# Patient Record
Sex: Male | Born: 1945 | Race: White | Hispanic: No | Marital: Single | Smoking: Never smoker
Health system: Southern US, Community
[De-identification: ages and names within clinical notes are randomized; demographics above are authoritative.]

## PROBLEM LIST (undated history)

## (undated) DIAGNOSIS — I4892 Unspecified atrial flutter: Secondary | ICD-10-CM

## (undated) DIAGNOSIS — I214 Non-ST elevation (NSTEMI) myocardial infarction: Secondary | ICD-10-CM

## (undated) DIAGNOSIS — N182 Chronic kidney disease, stage 2 (mild): Secondary | ICD-10-CM

## (undated) DIAGNOSIS — I959 Hypotension, unspecified: Secondary | ICD-10-CM

## (undated) DIAGNOSIS — R339 Retention of urine, unspecified: Secondary | ICD-10-CM

## (undated) DIAGNOSIS — E785 Hyperlipidemia, unspecified: Secondary | ICD-10-CM

## (undated) HISTORY — DX: Hyperlipidemia, unspecified: E78.5

## (undated) HISTORY — PX: VARICOSE VEIN SURGERY: SHX832

## (undated) HISTORY — PX: KNEE ARTHROSCOPY W/ MENISCAL REPAIR: SHX1877

## (undated) HISTORY — PX: BASAL CELL CARCINOMA EXCISION: SHX1214

---

## 2010-02-08 ENCOUNTER — Encounter
Admission: RE | Admit: 2010-02-08 | Discharge: 2010-02-08 | Payer: Self-pay | Source: Home / Self Care | Attending: Internal Medicine | Admitting: Internal Medicine

## 2010-08-15 ENCOUNTER — Encounter (INDEPENDENT_AMBULATORY_CARE_PROVIDER_SITE_OTHER): Payer: Self-pay | Admitting: Surgery

## 2010-08-17 ENCOUNTER — Encounter (INDEPENDENT_AMBULATORY_CARE_PROVIDER_SITE_OTHER): Payer: Self-pay | Admitting: Surgery

## 2011-04-20 ENCOUNTER — Other Ambulatory Visit: Payer: Self-pay | Admitting: Oncology

## 2015-01-28 ENCOUNTER — Other Ambulatory Visit: Payer: Self-pay | Admitting: Gastroenterology

## 2015-03-18 ENCOUNTER — Encounter (HOSPITAL_COMMUNITY): Payer: Self-pay | Admitting: *Deleted

## 2015-03-29 ENCOUNTER — Ambulatory Visit (HOSPITAL_COMMUNITY): Payer: Medicare Other | Admitting: Anesthesiology

## 2015-03-29 ENCOUNTER — Encounter (HOSPITAL_COMMUNITY)
Admission: RE | Disposition: A | Discharge status: Home / Self Care | Payer: Self-pay | Source: Ambulatory Visit | Attending: Gastroenterology

## 2015-03-29 ENCOUNTER — Ambulatory Visit (HOSPITAL_COMMUNITY)
Admission: RE | Admit: 2015-03-29 | Discharge: 2015-03-29 | Disposition: A | Discharge status: Home / Self Care | Payer: Medicare Other | Source: Ambulatory Visit | Attending: Gastroenterology | Admitting: Gastroenterology

## 2015-03-29 ENCOUNTER — Encounter (HOSPITAL_COMMUNITY): Payer: Self-pay

## 2015-03-29 DIAGNOSIS — Z85828 Personal history of other malignant neoplasm of skin: Secondary | ICD-10-CM | POA: Insufficient documentation

## 2015-03-29 DIAGNOSIS — Z1211 Encounter for screening for malignant neoplasm of colon: Secondary | ICD-10-CM | POA: Insufficient documentation

## 2015-03-29 DIAGNOSIS — E7800 Pure hypercholesterolemia, unspecified: Secondary | ICD-10-CM | POA: Insufficient documentation

## 2015-03-29 DIAGNOSIS — Z86010 Personal history of colonic polyps: Secondary | ICD-10-CM | POA: Insufficient documentation

## 2015-03-29 HISTORY — PX: COLONOSCOPY WITH PROPOFOL: SHX5780

## 2015-03-29 SURGERY — COLONOSCOPY WITH PROPOFOL
Anesthesia: Monitor Anesthesia Care

## 2015-03-29 MED ORDER — LIDOCAINE HCL (CARDIAC) 20 MG/ML IV SOLN
INTRAVENOUS | Status: AC
Start: 2015-03-29 — End: 2015-03-29
  Filled 2015-03-29: qty 5

## 2015-03-29 MED ORDER — PROPOFOL 10 MG/ML IV BOLUS
INTRAVENOUS | Status: DC | PRN
  Administered 2015-03-29: 20 mg via INTRAVENOUS
  Administered 2015-03-29: 10 mg via INTRAVENOUS
  Administered 2015-03-29: 30 mg via INTRAVENOUS
  Administered 2015-03-29: 20 mg via INTRAVENOUS

## 2015-03-29 MED ORDER — SODIUM CHLORIDE 0.9 % IV SOLN: INTRAVENOUS | Status: DC

## 2015-03-29 MED ORDER — PROPOFOL 10 MG/ML IV BOLUS
INTRAVENOUS | Status: AC
  Filled 2015-03-29: qty 60

## 2015-03-29 MED ORDER — LACTATED RINGERS IV SOLN
INTRAVENOUS | Status: DC
  Administered 2015-03-29: 1000 mL via INTRAVENOUS

## 2015-03-29 MED ORDER — PROPOFOL 500 MG/50ML IV EMUL
INTRAVENOUS | Status: DC | PRN
  Administered 2015-03-29: 100 ug/kg/min via INTRAVENOUS

## 2015-03-29 MED ORDER — LIDOCAINE HCL (CARDIAC) 20 MG/ML IV SOLN
INTRAVENOUS | Status: DC | PRN
  Administered 2015-03-29: 25 mg via INTRATRACHEAL

## 2015-03-29 SURGICAL SUPPLY — 21 items

## 2015-03-29 NOTE — Anesthesia Preprocedure Evaluation (Signed)
Anesthesia Evaluation  Patient identified by MRN, date of birth, ID band Patient awake    Reviewed: Allergy & Precautions, H&P , NPO status , Patient's Chart, lab work & pertinent test results  History of Anesthesia Complications Negative for: history of anesthetic complications  Airway Mallampati: II  TM Distance: >3 FB Neck ROM: full    Dental no notable dental hx.    Pulmonary neg pulmonary ROS,    Pulmonary exam normal breath sounds clear to auscultation       Cardiovascular negative cardio ROS Normal cardiovascular exam Rhythm:regular Rate:Normal     Neuro/Psych negative neurological ROS     GI/Hepatic negative GI ROS, Neg liver ROS,   Endo/Other  negative endocrine ROS  Renal/GU negative Renal ROS     Musculoskeletal   Abdominal   Peds  Hematology negative hematology ROS (+)   Anesthesia Other Findings   Reproductive/Obstetrics negative OB ROS                             Anesthesia Physical Anesthesia Plan  ASA: II  Anesthesia Plan: MAC   Post-op Pain Management:    Induction: Intravenous  Airway Management Planned: Simple Face Mask  Additional Equipment:   Intra-op Plan:   Post-operative Plan:   Informed Consent: I have reviewed the patients History and Physical, chart, labs and discussed the procedure including the risks, benefits and alternatives for the proposed anesthesia with the patient or authorized representative who has indicated his/her understanding and acceptance.   Dental Advisory Given  Plan Discussed with: Anesthesiologist, CRNA and Surgeon  Anesthesia Plan Comments:         Anesthesia Quick Evaluation  

## 2015-03-29 NOTE — H&P (Signed)
  Procedure: Surveillance colonoscopy. 12/07/2009 normal surveillance colonoscopy was. In 2005, two 2 mm adenomatous colon polyps were removed colonoscopically.  History: The patient is a 70 year old male born 18-Aug-1945. He is scheduled to undergo a surveillance colonoscopy today.  Medication allergies: None  Past medical history: Hypercholesterolemia. Left knee surgery. Varicose vein surgery. Basal cell skin cancer removal.  Exam: The patient is alert and lying comfortably on the endoscopy stretcher. Abdomen is soft and nontender to palpation. Lungs are clear to auscultation. Cardiac exam reveals a regular rhythm.  Plan: Proceed with surveillance colonoscopy

## 2015-03-29 NOTE — Anesthesia Postprocedure Evaluation (Signed)
Anesthesia Post Note  Patient: Richard Perkins  Procedure(s) Performed: Procedure(s) (LRB): COLONOSCOPY WITH PROPOFOL (N/A)  Patient location during evaluation: PACU Anesthesia Type: MAC Level of consciousness: awake and alert Pain management: pain level controlled Vital Signs Assessment: post-procedure vital signs reviewed and stable Respiratory status: spontaneous breathing, nonlabored ventilation, respiratory function stable and patient connected to nasal cannula oxygen Cardiovascular status: stable and blood pressure returned to baseline Anesthetic complications: no    Last Vitals:  Filed Vitals:   03/29/15 1020 03/29/15 1025  BP: 103/56 105/55  Pulse: 51 51  Temp:    Resp: 15 13    Last Pain: There were no vitals filed for this visit.               Reino KentJudd, Shubham Thackston J

## 2015-03-29 NOTE — Op Note (Signed)
[facility] Patient Name: Richard Perkins Procedure Date: 03/29/2015 MRN: [id number] Attending MD: Charolett Bumpers , MD Date of Birth: [birth year] CSN:  Age: 70 Admit Type: Outpatient Account #: [id number] Procedure:                Colonoscopy Indications:              High risk colon cancer surveillance: Personal                            history of adenoma less than 10 mm in size Providers:                Charolett Bumpers, MD, Anthony Sar, RN, Harrington Challenger, Technician, Clearnce Sorrel, Delphia Grates,                            CRNA Referring MD:              Medicines:                Propofol per Anesthesia Complications:            No immediate complications. Estimated Blood Loss:      Procedure:                Pre-Anesthesia Assessment:                           - Prior to the procedure, a History and Physical                            was performed, and patient medications and                            allergies were reviewed. The patient is competent.                            The risks and benefits of the procedure and the                            sedation options and risks were discussed with the                            patient. All questions were answered and informed                            consent was obtained. Patient identification and                            proposed procedure were verified. Mental Status                            Examination: normal. Prophylactic Antibiotics: The                            patient  does not require prophylactic antibiotics.                            Prior Anticoagulants: The patient has taken no                            previous anticoagulant or antiplatelet agents. ASA                            Grade Assessment: II - A patient with mild systemic                            disease. After reviewing the risks and benefits,                            the patient was deemed in  satisfactory condition to                            undergo the procedure. The anesthesia plan was to                            use monitored anesthesia care (MAC). Immediately                            prior to administration of medications, the patient                            was re-assessed for adequacy to receive sedatives.                            The heart rate, respiratory rate, oxygen                            saturations, blood pressure, adequacy of pulmonary                            ventilation, and response to care were monitored                            throughout the procedure. The physical status of                            the patient was re-assessed after the procedure.                           After obtaining informed consent, the colonoscope                            was passed under direct vision. Throughout the                            procedure, the patient's blood pressure, pulse, and  oxygen saturations were monitored continuously. The                            EC-[id number] ([id number]) scope was introduced through                            the anus and advanced to the the cecum, identified                            by appendiceal orifice and ileocecal valve. The                            colonoscopy was performed without difficulty. The                            patient tolerated the procedure well. The quality                            of the bowel preparation was adequate. The                            ileocecal valve, the appendiceal orifice and the                            rectum were photographed. Scope In: 9:38:20 AM Scope Out: 9:57:01 AM Scope Withdrawal Time: 0 hours 9 minutes 14 seconds  Total Procedure Duration: 0 hours 18 minutes 41 seconds  Findings:      The entire examined colon appeared normal. Impression:               - The entire examined colon is normal.                           - No specimens  collected. Moderate Sedation:      N/A- Per Anesthesia Care Recommendation:           - Patient has a contact number available for                            emergencies. The signs and symptoms of potential                            delayed complications were discussed with the                            patient. Return to normal activities tomorrow.                            Written discharge instructions were provided to the                            patient.                           - Patient has a contact number available for  emergencies. The signs and symptoms of potential                            delayed complications were discussed with the                            patient. Return to normal activities tomorrow.                            Written discharge instructions were provided to the                            patient.                           - Repeat colonoscopy in 5 years for surveillance. Procedure Code(s):        --- Professional ---                           W0981, Colorectal cancer screening; colonoscopy on                            individual at high risk Diagnosis Code(s):        --- Professional ---                           Z86.010, Personal history of colonic polyps CPT copyright 2016 American Medical Association. All rights reserved. The codes documented in this report are preliminary and upon coder review may  be revised to meet current compliance requirements. Danise Edge, [facility] Charolett Bumpers, MD 03/29/2015 10:06:06 AM This report has been signed electronically. Number of Addenda: 0

## 2015-03-29 NOTE — Discharge Instructions (Signed)

## 2015-03-29 NOTE — Transfer of Care (Signed)
Immediate Anesthesia Transfer of Care Note  Patient: Richard Perkins  Procedure(s) Performed: Procedure(s): COLONOSCOPY WITH PROPOFOL (N/A)  Patient Location: PACU and Endoscopy Unit  Anesthesia Type:MAC  Level of Consciousness: awake and patient cooperative  Airway & Oxygen Therapy: Patient Spontanous Breathing and Patient connected to face mask oxygen  Post-op Assessment: Report given to RN and Post -op Vital signs reviewed and stable  Post vital signs: Reviewed and stable  Last Vitals:  Filed Vitals:   03/29/15 0841  BP: 108/55  Pulse: 53  Temp: 36.4 C  Resp: 10    Complications: No apparent anesthesia complications

## 2015-03-31 ENCOUNTER — Encounter (HOSPITAL_COMMUNITY): Payer: Self-pay | Admitting: Gastroenterology

## 2018-01-18 ENCOUNTER — Inpatient Hospital Stay (HOSPITAL_COMMUNITY)
Admission: EM | Admit: 2018-01-18 | Discharge: 2018-01-22 | DRG: 281 | Disposition: A | Discharge status: Home / Self Care | Payer: Medicare Other | Attending: Internal Medicine | Admitting: Internal Medicine

## 2018-01-18 ENCOUNTER — Emergency Department (HOSPITAL_COMMUNITY): Payer: Medicare Other

## 2018-01-18 ENCOUNTER — Encounter (HOSPITAL_COMMUNITY): Payer: Self-pay | Admitting: Emergency Medicine

## 2018-01-18 DIAGNOSIS — I214 Non-ST elevation (NSTEMI) myocardial infarction: Secondary | ICD-10-CM | POA: Diagnosis not present

## 2018-01-18 DIAGNOSIS — N182 Chronic kidney disease, stage 2 (mild): Secondary | ICD-10-CM | POA: Diagnosis present

## 2018-01-18 DIAGNOSIS — I483 Typical atrial flutter: Secondary | ICD-10-CM | POA: Diagnosis not present

## 2018-01-18 DIAGNOSIS — Z8249 Family history of ischemic heart disease and other diseases of the circulatory system: Secondary | ICD-10-CM

## 2018-01-18 DIAGNOSIS — I959 Hypotension, unspecified: Secondary | ICD-10-CM | POA: Diagnosis present

## 2018-01-18 DIAGNOSIS — N183 Chronic kidney disease, stage 3 (moderate): Secondary | ICD-10-CM | POA: Diagnosis present

## 2018-01-18 DIAGNOSIS — R339 Retention of urine, unspecified: Secondary | ICD-10-CM | POA: Diagnosis present

## 2018-01-18 DIAGNOSIS — I4430 Unspecified atrioventricular block: Secondary | ICD-10-CM | POA: Diagnosis present

## 2018-01-18 DIAGNOSIS — R7989 Other specified abnormal findings of blood chemistry: Secondary | ICD-10-CM | POA: Diagnosis present

## 2018-01-18 DIAGNOSIS — I4892 Unspecified atrial flutter: Secondary | ICD-10-CM | POA: Diagnosis present

## 2018-01-18 DIAGNOSIS — R778 Other specified abnormalities of plasma proteins: Secondary | ICD-10-CM | POA: Diagnosis present

## 2018-01-18 DIAGNOSIS — Z85828 Personal history of other malignant neoplasm of skin: Secondary | ICD-10-CM

## 2018-01-18 DIAGNOSIS — Z79899 Other long term (current) drug therapy: Secondary | ICD-10-CM

## 2018-01-18 DIAGNOSIS — E785 Hyperlipidemia, unspecified: Secondary | ICD-10-CM | POA: Diagnosis present

## 2018-01-18 HISTORY — DX: Hypotension, unspecified: I95.9

## 2018-01-18 HISTORY — DX: Chronic kidney disease, stage 2 (mild): N18.2

## 2018-01-18 HISTORY — DX: Retention of urine, unspecified: R33.9

## 2018-01-18 HISTORY — DX: Non-ST elevation (NSTEMI) myocardial infarction: I21.4

## 2018-01-18 HISTORY — DX: Unspecified atrial flutter: I48.92

## 2018-01-18 LAB — COMPREHENSIVE METABOLIC PANEL
ALT: 22 U/L (ref 0–44)
AST: 29 U/L (ref 15–41)
Albumin: 3.9 g/dL (ref 3.5–5.0)
Alkaline Phosphatase: 36 U/L — ABNORMAL LOW (ref 38–126)
Anion gap: 6 (ref 5–15)
BUN: 18 mg/dL (ref 8–23)
CALCIUM: 9 mg/dL (ref 8.9–10.3)
CO2: 27 mmol/L (ref 22–32)
CREATININE: 1.26 mg/dL — AB (ref 0.61–1.24)
Chloride: 106 mmol/L (ref 98–111)
GFR, EST NON AFRICAN AMERICAN: 56 mL/min — AB (ref >60)
Glucose, Bld: 115 mg/dL — ABNORMAL HIGH (ref 70–99)
Potassium: 4.2 mmol/L (ref 3.5–5.1)
Sodium: 139 mmol/L (ref 135–145)
Total Bilirubin: 0.9 mg/dL (ref 0.3–1.2)
Total Protein: 6.5 g/dL (ref 6.5–8.1)

## 2018-01-18 LAB — CBC WITH DIFFERENTIAL/PLATELET
ABS IMMATURE GRANULOCYTES: 0.01 10*3/uL (ref 0.00–0.07)
BASOS PCT: 1 %
Basophils Absolute: 0.1 10*3/uL (ref 0.0–0.1)
Eosinophils Absolute: 0 10*3/uL (ref 0.0–0.5)
Eosinophils Relative: 1 %
HEMATOCRIT: 41.6 % (ref 39.0–52.0)
Hemoglobin: 13.1 g/dL (ref 13.0–17.0)
IMMATURE GRANULOCYTES: 0 %
Lymphocytes Relative: 18 %
Lymphs Abs: 0.9 10*3/uL (ref 0.7–4.0)
MCH: 32.2 pg (ref 26.0–34.0)
MCHC: 31.5 g/dL (ref 30.0–36.0)
MCV: 102.2 fL — AB (ref 80.0–100.0)
MONO ABS: 0.4 10*3/uL (ref 0.1–1.0)
MONOS PCT: 7 %
NEUTROS ABS: 3.8 10*3/uL (ref 1.7–7.7)
NEUTROS PCT: 73 %
PLATELETS: 243 10*3/uL (ref 150–400)
RBC: 4.07 MIL/uL — ABNORMAL LOW (ref 4.22–5.81)
RDW: 11.9 % (ref 11.5–15.5)
WBC: 5.2 10*3/uL (ref 4.0–10.5)
nRBC: 0 % (ref 0.0–0.2)

## 2018-01-18 LAB — HEPARIN LEVEL (UNFRACTIONATED): HEPARIN UNFRACTIONATED: 0.41 [IU]/mL (ref 0.30–0.70)

## 2018-01-18 LAB — I-STAT TROPONIN, ED
Troponin i, poc: 0.19 ng/mL (ref 0.00–0.08)
Troponin i, poc: 0.55 ng/mL (ref 0.00–0.08)

## 2018-01-18 LAB — TROPONIN I: Troponin I: 1.62 ng/mL (ref <0.03)

## 2018-01-18 MED ORDER — ATORVASTATIN CALCIUM 80 MG PO TABS
80.0000 mg | ORAL_TABLET | Freq: Every day | ORAL | Status: DC
  Administered 2018-01-20 – 2018-01-21 (×2): 80 mg via ORAL
  Filled 2018-01-18 (×2): qty 1

## 2018-01-18 MED ORDER — HEPARIN (PORCINE) 25000 UT/250ML-% IV SOLN
900.0000 [IU]/h | INTRAVENOUS | Status: DC
  Administered 2018-01-18 – 2018-01-19 (×2): 800 [IU]/h via INTRAVENOUS
  Administered 2018-01-20: 900 [IU]/h via INTRAVENOUS
  Filled 2018-01-18 (×3): qty 250

## 2018-01-18 MED ORDER — HEPARIN BOLUS VIA INFUSION
3900.0000 [IU] | Freq: Once | INTRAVENOUS | Status: AC
  Administered 2018-01-18: 3900 [IU] via INTRAVENOUS
  Filled 2018-01-18: qty 3900

## 2018-01-18 MED ORDER — TAMSULOSIN HCL 0.4 MG PO CAPS
0.4000 mg | ORAL_CAPSULE | Freq: Every day | ORAL | Status: DC
  Administered 2018-01-18 – 2018-01-21 (×4): 0.4 mg via ORAL
  Filled 2018-01-18 (×4): qty 1

## 2018-01-18 MED ORDER — ONDANSETRON HCL 4 MG/2ML IJ SOLN: 4.0000 mg | Freq: Four times a day (QID) | INTRAMUSCULAR | Status: DC | PRN

## 2018-01-18 MED ORDER — ACETAMINOPHEN 325 MG PO TABS
650.0000 mg | ORAL_TABLET | ORAL | Status: DC | PRN
  Administered 2018-01-21: 650 mg via ORAL
  Filled 2018-01-18: qty 2

## 2018-01-18 MED ORDER — ASPIRIN EC 81 MG PO TBEC
81.0000 mg | DELAYED_RELEASE_TABLET | Freq: Every day | ORAL | Status: DC
  Administered 2018-01-19 – 2018-01-22 (×3): 81 mg via ORAL
  Filled 2018-01-18 (×4): qty 1

## 2018-01-18 NOTE — ED Provider Notes (Signed)
[facility] Note   CSN: [id number] Arrival date & time: 01/18/18  1032     History   Chief Complaint Chief Complaint  Patient presents with  . Atrial Flutter    HPI Richard Perkins is a 73 y.o. male.  HPI 530Am woke up, felt sensation of being aware of breathing, left arm felt different, had feeling of chest pressure, 1.5/10 pressure today, shortness of breath, tightness, diaphoresis.   [facility] Found to be in atrial flutter. GIven aspirin and nitro with improvement and transferred to ED.  Bicycle 3-4 times per week, rides 30 min and walking dog, used to run doing less. At the beginning of bike ride if going uphill it is harder--maybe if going uphill one mile then will feel a little something.  Thinks after a mild maybe would have similar kind of symptoms but never enough of a symptom to get him to stop.    Hx of urinary retention, sees Urology  No recent surgeries, long trips, no immobilization, no hx of DVT/PE     Past Medical History:  Diagnosis Date  . Hyperlipidemia   . Urinary retention     There are no active problems to display for this patient.   Past Surgical History:  Procedure Laterality Date  . BASAL CELL CARCINOMA EXCISION    . COLONOSCOPY WITH PROPOFOL N/A 03/29/2015   Procedure: COLONOSCOPY WITH PROPOFOL;  Surgeon: Charolett BumpersMartin K Johnson, MD;  Location: [facility] ENDOSCOPY;  Service: Endoscopy;  Laterality: N/A;  . KNEE ARTHROSCOPY W/ MENISCAL REPAIR    . VARICOSE VEIN SURGERY          Home Medications    Prior to Admission medications   Medication Sig Start Date End Date Taking? Authorizing Provider  cholecalciferol (VITAMIN D) 1000 units tablet Take 2,000 Units by mouth daily.   Yes [provider]  tamsulosin (FLOMAX) 0.4 MG CAPS capsule Take 0.4 mg by mouth at bedtime.   Yes [provider]    Family History No family history on file.  Social  History Social History   Tobacco Use  . Smoking status: Never Smoker  . Smokeless tobacco: Never Used  Substance Use Topics  . Alcohol use: Yes    Comment: wine  nightly  . Drug use: Not on file     Allergies   Patient has no known allergies.   Review of Systems Review of Systems  Constitutional: Positive for diaphoresis. Negative for fever.  HENT: Negative for congestion.   Respiratory: Positive for shortness of breath. Negative for cough.   Cardiovascular: Positive for chest pain. Negative for leg swelling.  Gastrointestinal: Negative for abdominal pain, blood in stool (2 weeks ago had bright red but then resolved), diarrhea and nausea.  Genitourinary: Negative for dysuria.  Neurological: Negative for light-headedness and headaches.     Physical Exam Updated Vital Signs BP (!) 103/93   Pulse 66   Temp (!) 97.4 F (36.3 C) (Oral)   Resp 19   Ht 5' 8.5" (1.74 m)   Wt 65.3 kg   SpO2 99%   BMI 21.58 kg/m   Physical Exam Vitals signs and nursing note reviewed.  Constitutional:      General: He is not in acute distress.    Appearance: He is well-developed. He is not diaphoretic.  HENT:     Head: Normocephalic and atraumatic.  Eyes:     Conjunctiva/sclera: Conjunctivae normal.  Neck:  Musculoskeletal: Normal range of motion.  Cardiovascular:     Rate and Rhythm: Normal rate and regular rhythm.     Heart sounds: Normal heart sounds. No murmur. No friction rub. No gallop.   Pulmonary:     Effort: Pulmonary effort is normal. No respiratory distress.     Breath sounds: Normal breath sounds. No wheezing or rales.  Abdominal:     General: There is no distension.     Palpations: Abdomen is soft.     Tenderness: There is no abdominal tenderness. There is no guarding.  Skin:    General: Skin is warm and dry.  Neurological:     Mental Status: He is alert and oriented to person, place, and time.      ED Treatments / Results  Labs (all labs ordered are  listed, but only abnormal results are displayed) Labs Reviewed  CBC WITH DIFFERENTIAL/PLATELET - Abnormal; Notable for the following components:      Result Value   RBC 4.07 (*)    MCV 102.2 (*)    All other components within normal limits  COMPREHENSIVE METABOLIC PANEL - Abnormal; Notable for the following components:   Glucose, Bld 115 (*)    Creatinine, Ser 1.26 (*)    Alkaline Phosphatase 36 (*)    GFR calc non Af Amer 56 (*)    All other components within normal limits  I-STAT TROPONIN, ED - Abnormal; Notable for the following components:   Troponin i, poc 0.19 (*)    All other components within normal limits  I-STAT TROPONIN, ED - Abnormal; Notable for the following components:   Troponin i, poc 0.55 (*)    All other components within normal limits  HEPARIN LEVEL (UNFRACTIONATED)    EKG EKG Interpretation  Date/Time:  Friday January 18 2018 10:34:00 EST Ventricular Rate:  63 PR Interval:    QRS Duration: 84 QT Interval:  390 QTC Calculation: 400 R Axis:   76 Text Interpretation:  Atrial flutter with predominant 4:1 AV block Low voltage, precordial leads Repol abnrm suggests ischemia, inferior leads No previous ECGs available Confirmed by Alvira Monday ([id number]) on 01/18/2018 11:27:22 AM   Radiology Dg Chest Portable 1 View  Result Date: 01/18/2018 CLINICAL DATA:  73 year old male with left chest heaviness and shortness of breath on waking. Atrial fibrillation at presentation. EXAM: PORTABLE CHEST 1 VIEW COMPARISON:  None. FINDINGS: Portable AP semi upright view at 1108 hours. Mild cardiomegaly. Other mediastinal contours are within normal limits. Visualized tracheal air column is within normal limits. Upper limits of normal to mildly hyperinflated lung volumes. Allowing for portable technique the lungs are clear. No pneumothorax. No acute osseous abnormality identified. IMPRESSION: Mild cardiomegaly.  No acute pulmonary abnormality. Electronically Signed   By: Odessa Fleming M.D.    On: 01/18/2018 11:20    Procedures .Critical Care Performed by: Alvira Monday, MD Authorized by: Alvira Monday, MD   Critical care provider statement:    Critical care time (minutes):  45   Critical care was necessary to treat or prevent imminent or life-threatening deterioration of the following conditions:  Cardiac failure   Critical care was time spent personally by me on the following activities:  Discussions with consultants, discussions with primary provider, examination of patient, re-evaluation of patient's condition, ordering and review of radiographic studies and ordering and review of laboratory studies   (including critical care time)  Medications Ordered in ED Medications  heparin ADULT infusion 100 units/mL (25000 units/245mL sodium chloride 0.45%) (800 Units/hr Intravenous  New Bag/Given 01/18/18 1506)  heparin bolus via infusion 3,900 Units (3,900 Units Intravenous Bolus from Bag 01/18/18 1509)     Initial Impression / Assessment and Plan / ED Course  I have reviewed the triage vital signs and the nursing notes.  Pertinent labs & imaging results that were available during my care of the patient were reviewed by me and considered in my medical decision making (see chart for details).     73 year old male with a history of hyperlipidemia presents from his PCPs office with concern for chest pain and shortness of breath that began this morning, was found to have new onset of atrial flutter.  Called office to discuss patient in his symptoms.  Nurse at his outpatient office reports that his heart rate was in the 50s and 60s with atrial flutter, with no elevated rate.  Patient reports relief of chest pain with the nitro and aspirin he had received prior to arrival.  Denies any chest discomfort at this time, or "maybe point .1/10 chest pain," however is still in atrial flutter.  Given no elevated rate at the office or here, with resolution of symptoms however continuing  atrial flutter, I feel it is less likely that atrial flutter is the etiology of chest pain, and consider ACS as etiology of pain and flutter without elevated rate. I doubt PE given no risk factors, no asymmetric leg swelling, history more typical for chest pain.   Troponin checked and .19.  Consulted cardiology with concern for NSTEMI, rate controlled atrial flutter. Patient has received aspirin and nitro prior to arrival.  Chest pain-free.  Placed on heparin drip for concern of both an NSTEMI and atrial flutter.   Final Clinical Impressions(s) / ED Diagnoses   Final diagnoses:  NSTEMI (non-ST elevated myocardial infarction) (HCC)  Typical atrial flutter [facility](HCC)    ED Discharge Orders    None       Alvira MondaySchlossman, Arshan Jabs, MD 01/18/18 [phone]

## 2018-01-18 NOTE — ED Triage Notes (Signed)
Pt arrives via [facility] from [place] [facility] where he was being seen for Left chest pressure that woke him from sleep this am, states he did some breathing exercises at home that seemed to help his pain some but still decided to get checked out at pcp-12 lead there showed  A -flutter,no history of same. Pt rating pain 1/10, received 1 sl nitro and 324mg  asa. A/ox4, resp e/u, nad.

## 2018-01-18 NOTE — ED Notes (Signed)
Pt is pain free at this time. 0/10 verbalized by pt.

## 2018-01-18 NOTE — ED Notes (Signed)
ED Provider at bedside. 

## 2018-01-18 NOTE — H&P (Signed)
Cardiology Admission History and Physical:   Patient ID: Richard Perkins MRN: [id number]; DOB: Aug 24, 1945   Admission date: 01/18/2018  Primary Care Provider: Daisy Floro, MD Primary Cardiologist: New to [facility] Primary Electrophysiologist:  None   Chief Complaint:  Chest pain  Patient Profile:   Richard Perkins is a 73 y.o. male with a PMH of HLD and urinary retention, who was seen at his PCP office today for chest pain which awoke him from sleep, sent to the ED for further evaluation.   History of Present Illness:   Richard Perkins was in his usual state of health until he experienced chest pressure, SOB, and diaphoresis shortly after waking around 6:30am. He reported doing some breathing exercises at home which improved his symptoms but did not resolve them. He presented to his PCP office for these complaints and was found to be in atrial flutter on EKG and transferred to the ED for further evaluation.  No prior cardiac history and he has never had an ischemic evaluation. He is quite active at baseline, riding a bicycle 3-4 times per week for 30 minutes at a time,  swimming a couple days per week, and walking his dog. He feels like over the past year he gets more short of breath with less activity but has not experienced chest pain/pressure with activity.   At this time he is without chest pressure, SOB, or palpitations. He feels back to his usual self at this time and is unaware that he is still in atrial flutter. No complaints of orthopnea, PND, LE edema, melena, hematochezia, hematuria, nausea, vomiting, fever, or recent URI. No recent travel.    ED course: VSS. Labs notable for electrolytes wnl, Cr 1.26 (unclear baseline), Hgb 13.1, PLT 243, Trop 0.19>0.55. EKG with atrial flutter with variable AV block - no comparison. CXR with mild cardiomegaly, no acute findings. Patient was started on a heparin gtt. Cardiology asked to evaluate.    Past Medical History:  Diagnosis  Date  . Hyperlipidemia   . Urinary retention     Past Surgical History:  Procedure Laterality Date  . BASAL CELL CARCINOMA EXCISION    . COLONOSCOPY WITH PROPOFOL N/A 03/29/2015   Procedure: COLONOSCOPY WITH PROPOFOL;  Surgeon: Charolett Bumpers, MD;  Location: [facility];  Service: Endoscopy;  Laterality: N/A;  . KNEE ARTHROSCOPY W/ MENISCAL REPAIR    . VARICOSE VEIN SURGERY       Medications Prior to Admission: Prior to Admission medications   Medication Sig Start Date End Date Taking? Authorizing Provider  cholecalciferol (VITAMIN D) 1000 units tablet Take 2,000 Units by mouth daily.   Yes [provider]  tamsulosin (FLOMAX) 0.4 MG CAPS capsule Take 0.4 mg by mouth at bedtime.   Yes [provider]     Allergies:   No Known Allergies  Social History:   Social History   Socioeconomic History  . Marital status: Married    Spouse name: Not on file  . Number of children: Not on file  . Years of education: Not on file  . Highest education level: Not on file  Occupational History  . Not on file  Social Needs  . Financial resource strain: Not on file  . Food insecurity:    Worry: Not on file    Inability: Not on file  . Transportation needs:    Medical: Not on file    Non-medical: Not on file  Tobacco Use  . Smoking status: Never Smoker  .  Smokeless tobacco: Never Used  Substance and Sexual Activity  . Alcohol use: Yes    Comment: wine  nightly  . Drug use: Not on file  . Sexual activity: Not on file  Lifestyle  . Physical activity:    Days per week: Not on file    Minutes per session: Not on file  . Stress: Not on file  Relationships  . Social connections:    Talks on phone: Not on file    Gets together: Not on file    Attends religious service: Not on file    Active member of club or organization: Not on file    Attends meetings of clubs or organizations: Not on file    Relationship status: Not on file  . Intimate partner violence:     Fear of current or ex partner: Not on file    Emotionally abused: Not on file    Physically abused: Not on file    Forced sexual activity: Not on file  Other Topics Concern  . Not on file  Social History Narrative  . Not on file    Family History:  No family history of MI/CAD at an early age The patient's family history includes Heart failure in his mother.    ROS:  Please see the history of present illness.  All other ROS reviewed and negative.     Physical Exam/Data:   Vitals:   01/18/18 1200 01/18/18 1215 01/18/18 1315 01/18/18 1330  BP: 130/76 109/64 112/77 (!) 103/93  Pulse: (!) 56 63 67 66  Resp: 11 12 19 19   Temp:      TempSrc:      SpO2: 100% 100% 98% 99%  Weight:      Height:       No intake or output data in the 24 hours ending 01/18/18 1705 Filed Weights   01/18/18 1037  Weight: 65.3 kg   Body mass index is 21.58 kg/m.  General:  Well nourished, well developed, in no acute distress HEENT: sclera anicteric normal Neck: no JVD Vascular: No carotid bruits; distal pulses 2+ bilaterally Cardiac:  normal S1, S2; IRIR; no murmurs, rubs, or gallops Lungs:  clear to auscultation bilaterally, no wheezing, rhonchi or rales  Abd: soft, nontender, mildly distended  Ext: no edema Musculoskeletal:  No deformities, BUE and BLE strength normal and equal Skin: warm and dry  Neuro:  CNs 2-12 intact, no focal abnormalities noted Psych:  Normal affect    EKG:  atrial flutter with variable AV block - no comparison.  Relevant CV Studies: None  Laboratory Data:  Chemistry Recent Labs  Lab 01/18/18 1103  NA 139  K 4.2  CL 106  CO2 27  GLUCOSE 115*  BUN 18  CREATININE 1.26*  CALCIUM 9.0  GFRNONAA 56*  GFRAA >60  ANIONGAP 6    Recent Labs  Lab 01/18/18 1103  PROT 6.5  ALBUMIN 3.9  AST 29  ALT 22  ALKPHOS 36*  BILITOT 0.9   Hematology Recent Labs  Lab 01/18/18 1103  WBC 5.2  RBC 4.07*  HGB 13.1  HCT 41.6  MCV 102.2*  MCH 32.2  MCHC 31.5    RDW 11.9  PLT 243   Cardiac EnzymesNo results for input(s): TROPONINI in the last 168 hours.  Recent Labs  Lab 01/18/18 1240 01/18/18 1452  TROPIPOC 0.19* 0.55*    BNPNo results for input(s): BNP, PROBNP in the last 168 hours.  DDimer No results for input(s): DDIMER in the last  168 hours.  Radiology/Studies:  Dg Chest Portable 1 View  Result Date: 01/18/2018 CLINICAL DATA:  73 year old male with left chest heaviness and shortness of breath on waking. Atrial fibrillation at presentation. EXAM: PORTABLE CHEST 1 VIEW COMPARISON:  None. FINDINGS: Portable AP semi upright view at 1108 hours. Mild cardiomegaly. Other mediastinal contours are within normal limits. Visualized tracheal air column is within normal limits. Upper limits of normal to mildly hyperinflated lung volumes. Allowing for portable technique the lungs are clear. No pneumothorax. No acute osseous abnormality identified. IMPRESSION: Mild cardiomegaly.  No acute pulmonary abnormality. Electronically Signed   By: Odessa Fleming M.D.   On: 01/18/2018 11:20    Assessment and Plan:   1. New onset atrial flutter: patient experienced chest pressure, SOB, and mild diaphoresis. Presented to PCP office where he was found to be in atrial flutter on EKG. Transferred to [facility] for further evaluation. Possible onset of atrial flutter precipitated chest discomfort this morning as patient is currently unaware of ongoing atrial flutter. Rate has been well controlled in the 50s-60s. He was started on a heparin gtt in the ED.  - This patients CHA2DS2-VASc Score and unadjusted Ischemic Stroke Rate (% per year) is equal to at least 0.6 % stroke rate/year from a score of 1 Above score calculated as 1 point each if present [CHF, HTN, DM, Vascular=MI/PAD/Aortic Plaque, Age if 65-74, or Male] Above score calculated as 2 points each if present [Age > 75, or Stroke/TIA/TE] - Continue heparin gtt for now - consider transition to apixaban 5mg  BID if no further  invasive procedures planned.  - Will check an echocardiogram   2. Elevated troponin: As above, patient with chest tightness this morning. Trop trend 0.19>0.55 over 2 hours. EKG with new onset atrial flutter. No prior ischemic evaluation or cardiac history. Unclear at this time whether troponin elevation is true ACS vs demand ischemia given onset of atrial flutter.  - Will continue to trend trop x3 or to peak - Will check an echocardiogram - Further ischemic evaluation pending trop trend/ echo results.  - Will check a lipid panel and HgbA1C for risk stratification  3. HLD: patient reports being told his cholesterol was elevated in the past but he was recommended for dietary modifications. No currently on medications. - Will check FLP in AM  4. History of urinary retention: follows outpatient with urology. On tamsulosin. Lower abdomin mildly distended.  - Will check a bladder scan - Continue tamsulosin  5. CKD stage 3: patient reports being told his kidney function was not normal but does not recall his baseline Scr level. Cr 1.26 on admission - suspect this is his baseline - Continue to monitor Cr closely.      Severity of Illness: The appropriate patient status for this patient is OBSERVATION. Observation status is judged to be reasonable and necessary in order to provide the required intensity of service to ensure the patient's safety. The patient's presenting symptoms, physical exam findings, and initial radiographic and laboratory data in the context of their medical condition is felt to place them at decreased risk for further clinical deterioration. Furthermore, it is anticipated that the patient will be medically stable for discharge from the hospital within 2 midnights of admission. The following factors support the patient status of observation.   " The patient's presenting symptoms include chest tightness. " The physical exam findings include cardiac exam notable for IRIR. " The  initial radiographic and laboratory data are new onset atrial flutter.  For questions or updates, please contact [facility] Please consult [email or web address] for contact info under        Signed, Beatriz StallionKrista M. Ailey Wessling, PA-C  01/18/2018 5:05 PM

## 2018-01-18 NOTE — Progress Notes (Signed)
ANTICOAGULATION CONSULT NOTE  Pharmacy Consult for Heparin Indication: chest pain/ACS and atrial fibrillation  No Known Allergies  Patient Measurements: Height: 5\' 8"  (172.7 cm) Weight: 141 lb 6.4 oz (64.1 kg) IBW/kg (Calculated) : 68.4  Vital Signs: Temp: 97.6 F (36.4 C) (01/03 1913) Temp Source: Oral (01/03 1913) BP: 109/65 (01/03 2057) Pulse Rate: 57 (01/03 2057)  Labs: Recent Labs    01/18/18 1103 01/18/18 2205  HGB 13.1  --   HCT 41.6  --   PLT 243  --   HEPARINUNFRC  --  0.41  CREATININE 1.26*  --   TROPONINI  --  1.62*    Estimated Creatinine Clearance: 47.3 mL/min (A) (by C-G formula based on SCr of 1.26 mg/dL (H)).  Assessment: 73 y.o. male with chest pain for heparin   Goal of Therapy:  Heparin level 0.3-0.7 units/ml Monitor platelets by anticoagulation protocol: Yes   Plan:  Continue Heparin at current rate  Geannie Risen, PharmD, BCPS

## 2018-01-18 NOTE — Progress Notes (Addendum)
ANTICOAGULATION CONSULT NOTE - Initial Consult  Pharmacy Consult for Heparin Indication: chest pain/ACS and atrial fibrillation  No Known Allergies  Patient Measurements: Height: 5' 8.5" (174 cm) Weight: 144 lb (65.3 kg) IBW/kg (Calculated) : 69.55  Vital Signs: Temp: 97.4 F (36.3 C) (01/03 1037) Temp Source: Oral (01/03 1037) BP: 103/93 (01/03 1330) Pulse Rate: 66 (01/03 1330)  Labs: Recent Labs    01/18/18 1103  HGB 13.1  HCT 41.6  PLT 243  CREATININE 1.26*    Estimated Creatinine Clearance: 48.2 mL/min (A) (by C-G formula based on SCr of 1.26 mg/dL (H)).   Medical History: Past Medical History:  Diagnosis Date  . Hyperlipidemia   . Urinary retention     Assessment: 73 y.o. male woke up, felt sensation of being aware of breathing, left arm felt different, had feeling of chest pressure, 1.5/10 pressure today, shortness of breath, tightness, diaphoresis. Pharmacy consulted for heparin drip.  Goal of Therapy:  Heparin level 0.3-0.7 units/ml Monitor platelets by anticoagulation protocol: Yes   Plan:  Give 3900 units bolus x 1 Start heparin infusion at 800 units/hr Check anti-Xa level in 6 hours and daily while on heparin Continue to monitor H&H and platelets  Jeanella Cara, PharmD, [facility] Clinical Pharmacist Please see AMION for all Pharmacists' Contact Phone Numbers 01/18/2018, 2:47 PM

## 2018-01-18 NOTE — Progress Notes (Addendum)
CRITICAL VALUE ALERT  Critical Value:  Troponin  1.62  Date & Time Notied:  01/18/2018 11:30pm  Provider Notified: [facility]  Orders Received/Actions taken:

## 2018-01-19 ENCOUNTER — Observation Stay (HOSPITAL_BASED_OUTPATIENT_CLINIC_OR_DEPARTMENT_OTHER): Payer: Medicare Other

## 2018-01-19 DIAGNOSIS — I361 Nonrheumatic tricuspid (valve) insufficiency: Secondary | ICD-10-CM

## 2018-01-19 DIAGNOSIS — I214 Non-ST elevation (NSTEMI) myocardial infarction: Principal | ICD-10-CM

## 2018-01-19 DIAGNOSIS — I483 Typical atrial flutter: Secondary | ICD-10-CM | POA: Diagnosis not present

## 2018-01-19 LAB — CBC
HCT: 37.3 % — ABNORMAL LOW (ref 39.0–52.0)
Hemoglobin: 12.5 g/dL — ABNORMAL LOW (ref 13.0–17.0)
MCH: 33.4 pg (ref 26.0–34.0)
MCHC: 33.5 g/dL (ref 30.0–36.0)
MCV: 99.7 fL (ref 80.0–100.0)
Platelets: 270 10*3/uL (ref 150–400)
RBC: 3.74 MIL/uL — ABNORMAL LOW (ref 4.22–5.81)
RDW: 11.9 % (ref 11.5–15.5)
WBC: 5.3 10*3/uL (ref 4.0–10.5)
nRBC: 0 % (ref 0.0–0.2)

## 2018-01-19 LAB — BASIC METABOLIC PANEL
Anion gap: 9 (ref 5–15)
BUN: 16 mg/dL (ref 8–23)
CALCIUM: 8.8 mg/dL — AB (ref 8.9–10.3)
CO2: 24 mmol/L (ref 22–32)
Chloride: 107 mmol/L (ref 98–111)
Creatinine, Ser: 1.13 mg/dL (ref 0.61–1.24)
GFR calc Af Amer: >60 mL/min (ref >60)
GFR calc non Af Amer: >60 mL/min (ref >60)
Glucose, Bld: 85 mg/dL (ref 70–99)
Potassium: 4 mmol/L (ref 3.5–5.1)
Sodium: 140 mmol/L (ref 135–145)

## 2018-01-19 LAB — ECHOCARDIOGRAM COMPLETE
HEIGHTINCHES: 68 in
Weight: 2248 oz

## 2018-01-19 LAB — LIPID PANEL
Cholesterol: 199 mg/dL (ref 0–200)
HDL: 62 mg/dL (ref >40)
LDL Cholesterol: 119 mg/dL — ABNORMAL HIGH (ref 0–99)
Total CHOL/HDL Ratio: 3.2 RATIO
Triglycerides: 90 mg/dL (ref <150)
VLDL: 18 mg/dL (ref 0–40)

## 2018-01-19 LAB — TROPONIN I: Troponin I: 0.85 ng/mL (ref <0.03)

## 2018-01-19 LAB — HEPARIN LEVEL (UNFRACTIONATED): Heparin Unfractionated: 0.36 IU/mL (ref 0.30–0.70)

## 2018-01-19 LAB — HEMOGLOBIN A1C
Hgb A1c MFr Bld: 4.7 % — ABNORMAL LOW (ref 4.8–5.6)
MEAN PLASMA GLUCOSE: 88.19 mg/dL

## 2018-01-19 NOTE — Progress Notes (Signed)
Pt refused to take scheduled Lipitor, stated he would like to speak with cardiologist before he starts taking a cholesterol lowering drug.

## 2018-01-19 NOTE — Progress Notes (Addendum)
Progress Note  Patient Name: Richard Perkins Date of Encounter: 01/19/2018  Primary Cardiologist: No primary care provider on file.   Subjective   Feels well, laying comfortably in bed.  Daughter at bedside.  She left the room while we talked.  No longer having any significant chest discomfort.  Rise and fall troponin was noted however 1.5 peak.  Telemetry shows typical atrial flutter rate controlled without any beta-blocker.  Inpatient Medications    Scheduled Meds: . aspirin EC  81 mg Oral Daily  . atorvastatin  80 mg Oral q1800  . tamsulosin  0.4 mg Oral QHS   Continuous Infusions: . heparin 800 Units/hr (01/19/18 0542)   PRN Meds: acetaminophen, ondansetron (ZOFRAN) IV   Vital Signs    Vitals:   01/18/18 1730 01/18/18 1913 01/18/18 2057 01/19/18 0358  BP: 115/65 117/82 109/65 (!) 96/59  Pulse: (!) 58 73 (!) 57 87  Resp: 14   14  Temp:  97.6 F (36.4 C)  97.7 F (36.5 C)  TempSrc:  Oral  Oral  SpO2: 100%  100% 95%  Weight:  64.1 kg  63.7 kg  Height:  5\' 8"  (1.727 m)      Intake/Output Summary (Last 24 hours) at 01/19/2018 1217 Last data filed at 01/19/2018 0941 Gross per 24 hour  Intake 155.5 ml  Output 400 ml  Net -244.5 ml   Filed Weights   01/18/18 1037 01/18/18 1913 01/19/18 0358  Weight: 65.3 kg 64.1 kg 63.7 kg    Telemetry    Typical atrial flutter variable conduction, well rate controlled- Personally Reviewed  ECG    Typical atrial flutter variable conduction- Personally Reviewed  Physical Exam   GEN: No acute distress.   Neck: No JVD Cardiac:  Irregular, no murmurs, rubs, or gallops.  Respiratory: Clear to auscultation bilaterally. GI: Soft, nontender, non-distended  MS: No edema; No deformity. Neuro:  Nonfocal  Psych: Normal affect   Labs    Chemistry Recent Labs  Lab 01/18/18 1103 01/19/18 0343  NA 139 140  K 4.2 4.0  CL 106 107  CO2 27 24  GLUCOSE 115* 85  BUN 18 16  CREATININE 1.26* 1.13  CALCIUM 9.0 8.8*  PROT 6.5   --   ALBUMIN 3.9  --   AST 29  --   ALT 22  --   ALKPHOS 36*  --   BILITOT 0.9  --   GFRNONAA 56* >60  GFRAA >60 >60  ANIONGAP 6 9     Hematology Recent Labs  Lab 01/18/18 1103 01/19/18 0343  WBC 5.2 5.3  RBC 4.07* 3.74*  HGB 13.1 12.5*  HCT 41.6 37.3*  MCV 102.2* 99.7  MCH 32.2 33.4  MCHC 31.5 33.5  RDW 11.9 11.9  PLT 243 270    Cardiac Enzymes Recent Labs  Lab 01/18/18 2205 01/19/18 0343  TROPONINI 1.62* 0.85*    Recent Labs  Lab 01/18/18 1240 01/18/18 1452  TROPIPOC 0.19* 0.55*     BNPNo results for input(s): BNP, PROBNP in the last 168 hours.   DDimer No results for input(s): DDIMER in the last 168 hours.   Radiology    Dg Chest Portable 1 View  Result Date: 01/18/2018 CLINICAL DATA:  73 year old male with left chest heaviness and shortness of breath on waking. Atrial fibrillation at presentation. EXAM: PORTABLE CHEST 1 VIEW COMPARISON:  None. FINDINGS: Portable AP semi upright view at 1108 hours. Mild cardiomegaly. Other mediastinal contours are within normal limits. Visualized tracheal air column  is within normal limits. Upper limits of normal to mildly hyperinflated lung volumes. Allowing for portable technique the lungs are clear. No pneumothorax. No acute osseous abnormality identified. IMPRESSION: Mild cardiomegaly.  No acute pulmonary abnormality. Electronically Signed   By: Odessa Fleming M.D.   On: 01/18/2018 11:20    Cardiac Studies   Echo pending.  Cath pending.  Patient Profile     73 y.o. male with non-ST elevation myocardial infarction, newly diagnosed atrial flutter with rate control despite no beta-blocker or calcium channel blocker, history of urinary retention, chronic kidney disease stage III.  Assessment & Plan    Atrial flutter, typical, newly discovered - Elevated troponin noted in the setting however he did have chest discomfort, left arm discomfort associated with this as well as shortness of breath.  Also, there is no indication that  his heart rate was ever elevated.  He is well rate controlled on no agents. -Heart rate currently controlled in the 50s.  Avoiding beta-blocker or calcium channel blocker.  4:1 conduction. - CHADSVASc score is currently 1 for age.  I do think however we should try to get him out of the atrial flutter with cardioversion.  Because of this after catheterization I would initiate Eliquis or Xarelto.  For now he is on aspirin 81.  Elevated troponin/non-ST elevation myocardial infarction -This has climbed from 0.19 up to 1.62 now coming down to 0.85.  May be associated with underlying atrial flutter and perhaps tachycardia however he did have anginal symptoms surrounding this, mild left-sided chest discomfort with left arm discomfort as well, he rates this at 1.5 out of 10.  Currently on IV heparin.  I do think that we should proceed with cardiac catheterization on Monday to exclude the possibility of underlying coronary artery disease.  Risks and benefits have been explained including stroke heart attack death renal impairment bleeding.  Mild cardiomegaly noted on x-ray.  High intensity statin.  Urinary retention -Dr. Retta Diones.  Continue with medication.  No need for in and out catheterization unless this becomes a bigger issue.  Chronic kidney disease stage III - Creatinine 1.26.  Likely baseline.  Hydrate Sunday night.  For questions or updates, please contact [facility] Please consult [email or web address] for contact info under        Signed, Donato Schultz, MD  01/19/2018, 12:17 PM

## 2018-01-19 NOTE — Progress Notes (Signed)
  Echocardiogram 2D Echocardiogram has been performed.  Richard Perkins 01/19/2018, 2:47 PM

## 2018-01-19 NOTE — Progress Notes (Signed)
ANTICOAGULATION CONSULT NOTE - Initial Consult  Pharmacy Consult for Heparin Indication: chest pain/ACS and atrial fibrillation  No Known Allergies  Patient Measurements: Height: 5\' 8"  (172.7 cm) Weight: 140 lb 8 oz (63.7 kg) IBW/kg (Calculated) : 68.4  Vital Signs: Temp: 97.7 F (36.5 C) (01/04 0358) Temp Source: Oral (01/04 0358) BP: 96/59 (01/04 0358) Pulse Rate: 87 (01/04 0358)  Labs: Recent Labs    01/18/18 1103 01/18/18 2205 01/19/18 0343  HGB 13.1  --  12.5*  HCT 41.6  --  37.3*  PLT 243  --  270  HEPARINUNFRC  --  0.41 0.36  CREATININE 1.26*  --  1.13  TROPONINI  --  1.62* 0.85*    Estimated Creatinine Clearance: 52.5 mL/min (by C-G formula based on SCr of 1.13 mg/dL).   Medical History: Past Medical History:  Diagnosis Date  . Hyperlipidemia   . Urinary retention     Assessment: 73 y.o. with CP and elevated troponin and also noted with aflutter (CHADSVASC=1-2). Pharmacy consulted for heparin drip. Plans for DOAC later per notes -heparin level= 0.36, CBC stable  Goal of Therapy:  Heparin level 0.3-0.7 units/ml Monitor platelets by anticoagulation protocol: Yes   Plan:  No heparin changes needed Heparin level and CBC daily  Harland German, PharmD Clinical Pharmacist **Pharmacist phone directory can now be found on [email or web address] ([email or web address]).  Listed under [facility].

## 2018-01-20 ENCOUNTER — Other Ambulatory Visit: Payer: Self-pay

## 2018-01-20 DIAGNOSIS — I214 Non-ST elevation (NSTEMI) myocardial infarction: Secondary | ICD-10-CM | POA: Diagnosis not present

## 2018-01-20 DIAGNOSIS — I483 Typical atrial flutter: Secondary | ICD-10-CM | POA: Diagnosis not present

## 2018-01-20 LAB — CBC
HCT: 38.6 % — ABNORMAL LOW (ref 39.0–52.0)
Hemoglobin: 12.5 g/dL — ABNORMAL LOW (ref 13.0–17.0)
MCH: 32.2 pg (ref 26.0–34.0)
MCHC: 32.4 g/dL (ref 30.0–36.0)
MCV: 99.5 fL (ref 80.0–100.0)
PLATELETS: 264 10*3/uL (ref 150–400)
RBC: 3.88 MIL/uL — ABNORMAL LOW (ref 4.22–5.81)
RDW: 11.9 % (ref 11.5–15.5)
WBC: 5.8 10*3/uL (ref 4.0–10.5)
nRBC: 0 % (ref 0.0–0.2)

## 2018-01-20 LAB — HEPARIN LEVEL (UNFRACTIONATED): Heparin Unfractionated: 0.25 IU/mL — ABNORMAL LOW (ref 0.30–0.70)

## 2018-01-20 MED ORDER — SODIUM CHLORIDE 0.9 % WEIGHT BASED INFUSION
1.0000 mL/kg/h | INTRAVENOUS | Status: DC
  Administered 2018-01-21: 1 mL/kg/h via INTRAVENOUS

## 2018-01-20 MED ORDER — SODIUM CHLORIDE 0.9 % IV SOLN: 250.0000 mL | INTRAVENOUS | Status: DC | PRN

## 2018-01-20 MED ORDER — SODIUM CHLORIDE 0.9% FLUSH: 3.0000 mL | INTRAVENOUS | Status: DC | PRN

## 2018-01-20 MED ORDER — ASPIRIN 81 MG PO CHEW
81.0000 mg | CHEWABLE_TABLET | ORAL | Status: AC
  Administered 2018-01-21: 81 mg via ORAL
  Filled 2018-01-20: qty 1

## 2018-01-20 MED ORDER — SODIUM CHLORIDE 0.9% FLUSH: 3.0000 mL | Freq: Two times a day (BID) | INTRAVENOUS | Status: DC

## 2018-01-20 NOTE — Plan of Care (Signed)
  Problem: Education: Goal: Knowledge of disease or condition will improve Outcome: Progressing   Problem: Activity: Goal: Ability to tolerate increased activity will improve Outcome: Completed/Met   Problem: Clinical Measurements: Goal: Respiratory complications will improve Outcome: Completed/Met

## 2018-01-20 NOTE — Progress Notes (Signed)
Progress Note  Patient Name: Richard Perkins Date of Encounter: 01/20/2018  Primary Cardiologist: Armanda Magic, MD   Subjective   Refused to take statin last night.  Wanted to talk to me about that before taking.  Troponin trending downward, 1.6 peak.  Typical atrial flutter consistent.  IV heparin currently awaiting cath.  Several questions answered  Inpatient Medications    Scheduled Meds: . aspirin EC  81 mg Oral Daily  . atorvastatin  80 mg Oral q1800  . tamsulosin  0.4 mg Oral QHS   Continuous Infusions: . heparin 900 Units/hr (01/20/18 0558)   PRN Meds: acetaminophen, ondansetron (ZOFRAN) IV   Vital Signs    Vitals:   01/19/18 1830 01/19/18 1900 01/19/18 2118 01/20/18 0550  BP: 117/77  102/65 (!) 94/59  Pulse: 85  86 65  Resp: 15 (!) 26 15 15   Temp: 98 F (36.7 C)  97.9 F (36.6 C) 97.9 F (36.6 C)  TempSrc: Oral  Oral Oral  SpO2: 97%  97% 99%  Weight:    63.4 kg  Height:        Intake/Output Summary (Last 24 hours) at 01/20/2018 1047 Last data filed at 01/20/2018 0906 Gross per 24 hour  Intake 300 ml  Output 1450 ml  Net -1150 ml   Filed Weights   01/18/18 1913 01/19/18 0358 01/20/18 0550  Weight: 64.1 kg 63.7 kg 63.4 kg    Telemetry    Continue typical atrial flutter well rate controlled on no AV nodal blocking agents- Personally Reviewed  ECG    Typical atrial flutter variable conduction- Personally Reviewed no new  Physical Exam   GEN: Well nourished, well developed, in no acute distress  HEENT: normal  Neck: no JVD, carotid bruits, or masses Cardiac: Irregular, normal rate; no murmurs, rubs, or gallops,no edema  Respiratory:  clear to auscultation bilaterally, normal work of breathing GI: soft, nontender, nondistended, + BS MS: no deformity or atrophy  Skin: warm and dry, no rash Neuro:  Alert and Oriented x 3, Strength and sensation are intact Psych: euthymic mood, full affect   Labs    Chemistry Recent Labs  Lab  01/18/18 1103 01/19/18 0343  NA 139 140  K 4.2 4.0  CL 106 107  CO2 27 24  GLUCOSE 115* 85  BUN 18 16  CREATININE 1.26* 1.13  CALCIUM 9.0 8.8*  PROT 6.5  --   ALBUMIN 3.9  --   AST 29  --   ALT 22  --   ALKPHOS 36*  --   BILITOT 0.9  --   GFRNONAA 56* >60  GFRAA >60 >60  ANIONGAP 6 9     Hematology Recent Labs  Lab 01/18/18 1103 01/19/18 0343 01/20/18 0430  WBC 5.2 5.3 5.8  RBC 4.07* 3.74* 3.88*  HGB 13.1 12.5* 12.5*  HCT 41.6 37.3* 38.6*  MCV 102.2* 99.7 99.5  MCH 32.2 33.4 32.2  MCHC 31.5 33.5 32.4  RDW 11.9 11.9 11.9  PLT 243 270 264    Cardiac Enzymes Recent Labs  Lab 01/18/18 2205 01/19/18 0343  TROPONINI 1.62* 0.85*    Recent Labs  Lab 01/18/18 1240 01/18/18 1452  TROPIPOC 0.19* 0.55*     BNPNo results for input(s): BNP, PROBNP in the last 168 hours.   DDimer No results for input(s): DDIMER in the last 168 hours.   Radiology    Dg Chest Portable 1 View  Result Date: 01/18/2018 CLINICAL DATA:  73 year old male with left chest heaviness and  shortness of breath on waking. Atrial fibrillation at presentation. EXAM: PORTABLE CHEST 1 VIEW COMPARISON:  None. FINDINGS: Portable AP semi upright view at 1108 hours. Mild cardiomegaly. Other mediastinal contours are within normal limits. Visualized tracheal air column is within normal limits. Upper limits of normal to mildly hyperinflated lung volumes. Allowing for portable technique the lungs are clear. No pneumothorax. No acute osseous abnormality identified. IMPRESSION: Mild cardiomegaly.  No acute pulmonary abnormality. Electronically Signed   By: Odessa Fleming M.D.   On: 01/18/2018 11:20    Cardiac Studies   ECHO 01/19/18: - Left ventricle: The cavity size was normal. Wall thickness was   normal. Systolic function was normal. The estimated ejection   fraction was in the range of 50% to 55%. Wall motion was normal;   there were no regional wall motion abnormalities. The study is   not technically sufficient  to allow evaluation of LV diastolic   function. - Left atrium: The atrium was mildly dilated. - Right atrium: The atrium was mildly dilated.  Impressions:  - Normal LV systolic function; mild biatrial enlargement; mild TR.    Cath pending.  Patient Profile     73 y.o. male with non-ST elevation myocardial infarction, newly diagnosed atrial flutter with rate control despite no beta-blocker or calcium channel blocker, history of urinary retention, chronic kidney disease stage III.  Assessment & Plan    Atrial flutter, typical, newly discovered - Elevated troponin noted in this setting however he did have chest discomfort, left arm discomfort associated with this as well as shortness of breath.  Also, there is no indication that his heart rate was ever elevated.  He is well rate controlled on no agents. -Heart rate currently controlled in the 50s.  Avoiding beta-blocker or calcium channel blocker.  4:1 conduction.  No changes made - CHADSVASc score is currently 1 for age.  I do think however we should try to get him out of the atrial flutter with cardioversion.  Because of this after catheterization I would initiate Eliquis or Xarelto.  For now he is on aspirin 81 as well as IV heparin.  Elevated troponin/non-ST elevation myocardial infarction -This has climbed from 0.19 up to 1.62 now coming down to 0.85.  May be associated with underlying atrial flutter and perhaps tachycardia however he did have anginal symptoms surrounding this, mild left-sided chest discomfort with left arm discomfort as well, he rates this at 1.5 out of 10.  Currently on IV heparin.  I do think that we should proceed with cardiac catheterization on Monday to exclude the possibility of underlying coronary artery disease.  Risks and benefits have been explained including stroke heart attack death renal impairment bleeding.  Mild cardiomegaly noted on x-ray.  High intensity statin was refused last night, encourage use for  plaque stabilization.  Urinary retention -Dr. Retta Diones.  Continue with medication.  No need for in and out catheterization unless this becomes a bigger issue.  He mentioned this quite a bit yesterday.  He had an appointment with them on Monday that will need to be rescheduled.  Chronic kidney disease stage III - Creatinine 1.26 down to 1.13.  Likely baseline.  Hydrate Sunday night.  No changes -Excellent hemoglobin A1c 4.7  Hyperlipidemia -LDL 119, encourage statin especially with elevated troponin  For questions or updates, please contact [facility] Please consult [email or web address] for contact info under        Signed, Donato Schultz, MD  01/20/2018, 10:47 AM

## 2018-01-20 NOTE — Progress Notes (Signed)
ANTICOAGULATION CONSULT NOTE  Pharmacy Consult for Heparin Indication: chest pain/ACS and atrial fibrillation  No Known Allergies  Patient Measurements: Height: 5\' 8"  (172.7 cm) Weight: 139 lb 12.8 oz (63.4 kg) IBW/kg (Calculated) : 68.4  Vital Signs: Temp: 97.9 F (36.6 C) (01/05 0550) Temp Source: Oral (01/05 0550) BP: 94/59 (01/05 0550) Pulse Rate: 65 (01/05 0550)  Labs: Recent Labs    01/18/18 1103 01/18/18 2205 01/19/18 0343 01/20/18 0430  HGB 13.1  --  12.5* 12.5*  HCT 41.6  --  37.3* 38.6*  PLT 243  --  270 264  HEPARINUNFRC  --  0.41 0.36 0.25*  CREATININE 1.26*  --  1.13  --   TROPONINI  --  1.62* 0.85*  --     Estimated Creatinine Clearance: 52.2 mL/min (by C-G formula based on SCr of 1.13 mg/dL).  Assessment: 73 y.o. male with chest pain for heparin   Goal of Therapy:  Heparin level 0.3-0.7 units/ml Monitor platelets by anticoagulation protocol: Yes   Plan:  Increase Heparin 900 units/hr    Geannie Risen, PharmD, BCPS

## 2018-01-21 ENCOUNTER — Encounter (HOSPITAL_COMMUNITY): Payer: Self-pay

## 2018-01-21 ENCOUNTER — Encounter (HOSPITAL_COMMUNITY)
Admission: EM | Disposition: A | Discharge status: Home / Self Care | Payer: Self-pay | Source: Home / Self Care | Attending: Cardiology

## 2018-01-21 DIAGNOSIS — I214 Non-ST elevation (NSTEMI) myocardial infarction: Secondary | ICD-10-CM | POA: Diagnosis present

## 2018-01-21 DIAGNOSIS — I4892 Unspecified atrial flutter: Secondary | ICD-10-CM

## 2018-01-21 DIAGNOSIS — N183 Chronic kidney disease, stage 3 (moderate): Secondary | ICD-10-CM | POA: Diagnosis present

## 2018-01-21 DIAGNOSIS — I483 Typical atrial flutter: Secondary | ICD-10-CM

## 2018-01-21 DIAGNOSIS — E785 Hyperlipidemia, unspecified: Secondary | ICD-10-CM | POA: Diagnosis present

## 2018-01-21 DIAGNOSIS — R7989 Other specified abnormal findings of blood chemistry: Secondary | ICD-10-CM | POA: Diagnosis not present

## 2018-01-21 DIAGNOSIS — I959 Hypotension, unspecified: Secondary | ICD-10-CM | POA: Diagnosis present

## 2018-01-21 DIAGNOSIS — Z8249 Family history of ischemic heart disease and other diseases of the circulatory system: Secondary | ICD-10-CM | POA: Diagnosis not present

## 2018-01-21 DIAGNOSIS — I4430 Unspecified atrioventricular block: Secondary | ICD-10-CM | POA: Diagnosis present

## 2018-01-21 DIAGNOSIS — Z79899 Other long term (current) drug therapy: Secondary | ICD-10-CM | POA: Diagnosis not present

## 2018-01-21 DIAGNOSIS — R339 Retention of urine, unspecified: Secondary | ICD-10-CM | POA: Diagnosis present

## 2018-01-21 DIAGNOSIS — Z85828 Personal history of other malignant neoplasm of skin: Secondary | ICD-10-CM | POA: Diagnosis not present

## 2018-01-21 HISTORY — PX: LEFT HEART CATH AND CORONARY ANGIOGRAPHY: CATH118249

## 2018-01-21 LAB — CBC
HCT: 39 % (ref 39.0–52.0)
Hemoglobin: 12.6 g/dL — ABNORMAL LOW (ref 13.0–17.0)
MCH: 32.4 pg (ref 26.0–34.0)
MCHC: 32.3 g/dL (ref 30.0–36.0)
MCV: 100.3 fL — ABNORMAL HIGH (ref 80.0–100.0)
Platelets: 256 10*3/uL (ref 150–400)
RBC: 3.89 MIL/uL — AB (ref 4.22–5.81)
RDW: 12.1 % (ref 11.5–15.5)
WBC: 5.3 10*3/uL (ref 4.0–10.5)
nRBC: 0 % (ref 0.0–0.2)

## 2018-01-21 LAB — HEPARIN LEVEL (UNFRACTIONATED): Heparin Unfractionated: 0.58 IU/mL (ref 0.30–0.70)

## 2018-01-21 SURGERY — LEFT HEART CATH AND CORONARY ANGIOGRAPHY
Anesthesia: LOCAL

## 2018-01-21 MED ORDER — SODIUM CHLORIDE 0.9% FLUSH
3.0000 mL | INTRAVENOUS | Status: DC | PRN
  Administered 2018-01-22: 3 mL via INTRAVENOUS
  Filled 2018-01-21: qty 3

## 2018-01-21 MED ORDER — HEPARIN (PORCINE) IN NACL 1000-0.9 UT/500ML-% IV SOLN
INTRAVENOUS | Status: DC | PRN
  Administered 2018-01-21 (×2): 500 mL

## 2018-01-21 MED ORDER — MIDAZOLAM HCL 2 MG/2ML IJ SOLN
INTRAMUSCULAR | Status: AC
  Filled 2018-01-21: qty 2

## 2018-01-21 MED ORDER — LIDOCAINE HCL (PF) 1 % IJ SOLN
INTRAMUSCULAR | Status: DC | PRN
  Administered 2018-01-21: 2 mL

## 2018-01-21 MED ORDER — SODIUM CHLORIDE 0.9% FLUSH
3.0000 mL | Freq: Two times a day (BID) | INTRAVENOUS | Status: DC
  Administered 2018-01-21 – 2018-01-22 (×2): 3 mL via INTRAVENOUS

## 2018-01-21 MED ORDER — HEPARIN SODIUM (PORCINE) 1000 UNIT/ML IJ SOLN
INTRAMUSCULAR | Status: DC | PRN
  Administered 2018-01-21: 3000 [IU] via INTRAVENOUS

## 2018-01-21 MED ORDER — SODIUM CHLORIDE 0.9 % IV SOLN: INTRAVENOUS | Status: AC

## 2018-01-21 MED ORDER — VERAPAMIL HCL 2.5 MG/ML IV SOLN
INTRAVENOUS | Status: AC
  Filled 2018-01-21: qty 2

## 2018-01-21 MED ORDER — SODIUM CHLORIDE 0.9 % IV SOLN: 250.0000 mL | INTRAVENOUS | Status: DC | PRN

## 2018-01-21 MED ORDER — IOHEXOL 350 MG/ML SOLN
INTRAVENOUS | Status: DC | PRN
  Administered 2018-01-21: 30 mL via INTRAVENOUS

## 2018-01-21 MED ORDER — FENTANYL CITRATE (PF) 100 MCG/2ML IJ SOLN
INTRAMUSCULAR | Status: DC | PRN
  Administered 2018-01-21: 25 ug via INTRAVENOUS

## 2018-01-21 MED ORDER — LIDOCAINE HCL (PF) 1 % IJ SOLN
INTRAMUSCULAR | Status: AC
  Filled 2018-01-21: qty 30

## 2018-01-21 MED ORDER — VERAPAMIL HCL 2.5 MG/ML IV SOLN
INTRAVENOUS | Status: DC | PRN
  Administered 2018-01-21: 18:00:00 via INTRA_ARTERIAL

## 2018-01-21 MED ORDER — HEPARIN (PORCINE) IN NACL 1000-0.9 UT/500ML-% IV SOLN
INTRAVENOUS | Status: AC
  Filled 2018-01-21: qty 1000

## 2018-01-21 MED ORDER — HEPARIN (PORCINE) 25000 UT/250ML-% IV SOLN
1100.0000 [IU]/h | INTRAVENOUS | Status: DC
  Administered 2018-01-22: 900 [IU]/h via INTRAVENOUS
  Filled 2018-01-21: qty 250

## 2018-01-21 MED ORDER — HEPARIN SODIUM (PORCINE) 1000 UNIT/ML IJ SOLN
INTRAMUSCULAR | Status: AC
  Filled 2018-01-21: qty 1

## 2018-01-21 MED ORDER — MIDAZOLAM HCL 2 MG/2ML IJ SOLN
INTRAMUSCULAR | Status: DC | PRN
  Administered 2018-01-21 (×2): 1 mg via INTRAVENOUS

## 2018-01-21 MED ORDER — FENTANYL CITRATE (PF) 100 MCG/2ML IJ SOLN
INTRAMUSCULAR | Status: AC
  Filled 2018-01-21: qty 2

## 2018-01-21 MED ORDER — SODIUM CHLORIDE 0.9 % IV SOLN
INTRAVENOUS | Status: AC | PRN
  Administered 2018-01-21: 250 mL via INTRAVENOUS

## 2018-01-21 SURGICAL SUPPLY — 9 items

## 2018-01-21 NOTE — Progress Notes (Signed)
Progress Note  Patient Name: Richard PicklerWilliam A Sofia Date of Encounter: 01/21/2018  Primary Cardiologist: Armanda Magicraci Turner, MD  Subjective   Pending cath today. Has not had chest pain since Friday night. Asymptomatic with flutter. BP soft this morning but just rechecked and was 114 SBP. His BP tends to run 90s-100 at baseline.   Inpatient Medications    Scheduled Meds: . aspirin EC  81 mg Oral Daily  . atorvastatin  80 mg Oral q1800  . sodium chloride flush  3 mL Intravenous Q12H  . tamsulosin  0.4 mg Oral QHS   Continuous Infusions: . sodium chloride    . sodium chloride 1 mL/kg/hr (01/21/18 0415)  . heparin 900 Units/hr (01/21/18 0442)   PRN Meds: sodium chloride, acetaminophen, ondansetron (ZOFRAN) IV, sodium chloride flush   Vital Signs    Vitals:   01/20/18 2126 01/21/18 0620 01/21/18 0621 01/21/18 0624  BP: (!) 97/58 (!) 83/62  (!) 83/62  Pulse: 67 64  63  Resp:    18  Temp: 98.1 F (36.7 C) 97.8 F (36.6 C)  97.8 F (36.6 C)  TempSrc: Oral Oral  Oral  SpO2: 100% 98%  99%  Weight:   63.8 kg   Height:        Intake/Output Summary (Last 24 hours) at 01/21/2018 0943 Last data filed at 01/21/2018 [id number] Gross per 24 hour  Intake 900.31 ml  Output 1875 ml  Net -974.69 ml   Filed Weights   01/19/18 0358 01/20/18 0550 01/21/18 0621  Weight: 63.7 kg 63.4 kg 63.8 kg    Telemetry    Atrial flutter CVR, occ brady into upper 40s - Personally Reviewed  Physical Exam   GEN: No acute distress.  HEENT: Normocephalic, atraumatic, sclera non-icteric. Neck: No JVD or bruits. Cardiac: Irregularly irregular, no murmurs, rubs, or gallops.  Radials/DP/PT 1+ and equal bilaterally.  Respiratory: Clear to auscultation bilaterally. Breathing is unlabored. GI: Soft, nontender, non-distended, BS +x 4. MS: L antecubital ecchymosis extending down L mid forearm related to prior IV blood draw, no warmth, suppuration or tenseness Extremities: No clubbing or cyanosis. No edema. Distal pedal  pulses are 2+ and equal bilaterally. Neuro:  AAOx3. Follows commands. Psych:  Responds to questions appropriately with a normal affect.  Labs    Chemistry Recent Labs  Lab 01/18/18 1103 01/19/18 0343  NA 139 140  K 4.2 4.0  CL 106 107  CO2 27 24  GLUCOSE 115* 85  BUN 18 16  CREATININE 1.26* 1.13  CALCIUM 9.0 8.8*  PROT 6.5  --   ALBUMIN 3.9  --   AST 29  --   ALT 22  --   ALKPHOS 36*  --   BILITOT 0.9  --   GFRNONAA 56* >60  GFRAA >60 >60  ANIONGAP 6 9     Hematology Recent Labs  Lab 01/19/18 0343 01/20/18 0430 01/21/18 0710  WBC 5.3 5.8 5.3  RBC 3.74* 3.88* 3.89*  HGB 12.5* 12.5* 12.6*  HCT 37.3* 38.6* 39.0  MCV 99.7 99.5 100.3*  MCH 33.4 32.2 32.4  MCHC 33.5 32.4 32.3  RDW 11.9 11.9 12.1  PLT 270 264 256    Cardiac Enzymes Recent Labs  Lab 01/18/18 2205 01/19/18 0343  TROPONINI 1.62* 0.85*    Recent Labs  Lab 01/18/18 1240 01/18/18 1452  TROPIPOC 0.19* 0.55*     Radiology    No results found.  Cardiac Studies   2D echo 01/19/18 Study Conclusions  - Left ventricle: The cavity  size was normal. Wall thickness was   normal. Systolic function was normal. The estimated ejection   fraction was in the range of 50% to 55%. Wall motion was normal;   there were no regional wall motion abnormalities. The study is   not technically sufficient to allow evaluation of LV diastolic   function. - Left atrium: The atrium was mildly dilated. - Right atrium: The atrium was mildly dilated.  Impressions:  - Normal LV systolic function; mild biatrial enlargement; mild TR.   Patient Profile     73 y.o. male with history of HLD, urinary retention, probable CKD III presented to PCP with new onset chest pressure, SOB, and diaphoresis - found to be in atrial flutter with variable AV block and NSTEMI with peak troponin 1.62  Assessment & Plan    1. NSTEMI - continue ASA, heparin. No BB given bradycardia. Now on statin. Plan cath today. Risks/benefits  discussed with pt yesterday; reiterated today. He is agreeable to proceed. LVEF normal by echo 1/4.  2. New onset atrial flutter - rate controlled in absence of any AV blocking agents. Continue IV heparin. Chadsvasc is currently 1 for age but may be 2 if CAD is noted. Prior MD notes indicate recommendation to begin Eliquis or Xarelto post cath for consideration of DCCV. He is now asymptomatic so it would be reasonable to pursue as OP after 3 weeks of uninterrupted anticoagulation. Will also check thyroid function for baseline.  3. Hypotension - nadir 83/62. Probably baseline for patient. Recheck BP 114 systolic this AM, asymptomatic.  4. Hyperlipidemia - started on statin this admission. If the patient is tolerating statin at time of follow-up appointment, would consider rechecking liver function/lipid panel in 6-8 weeks.  5. Probable CKD III - stable on recheck 1/4. Repeat in AM.   For questions or updates, please contact [facility] Please consult [email or web address] for contact info under Cardiology/STEMI.  Signed, Laurann Montanaayna N , PA-C 01/21/2018, 9:43 AM

## 2018-01-21 NOTE — H&P (View-Only) (Signed)
 Progress Note  Patient Name: Richard Perkins Date of Encounter: 01/21/2018  Primary Cardiologist: Traci Turner, MD  Subjective   Pending cath today. Has not had chest pain since Friday night. Asymptomatic with flutter. BP soft this morning but just rechecked and was 114 SBP. His BP tends to run 90s-100 at baseline.   Inpatient Medications    Scheduled Meds: . aspirin EC  81 mg Oral Daily  . atorvastatin  80 mg Oral q1800  . sodium chloride flush  3 mL Intravenous Q12H  . tamsulosin  0.4 mg Oral QHS   Continuous Infusions: . sodium chloride    . sodium chloride 1 mL/kg/hr (01/21/18 0415)  . heparin 900 Units/hr (01/21/18 0442)   PRN Meds: sodium chloride, acetaminophen, ondansetron (ZOFRAN) IV, sodium chloride flush   Vital Signs    Vitals:   01/20/18 2126 01/21/18 0620 01/21/18 0621 01/21/18 0624  BP: (!) 97/58 (!) 83/62  (!) 83/62  Pulse: 67 64  63  Resp:    18  Temp: 98.1 F (36.7 C) 97.8 F (36.6 C)  97.8 F (36.6 C)  TempSrc: Oral Oral  Oral  SpO2: 100% 98%  99%  Weight:   63.8 kg   Height:        Intake/Output Summary (Last 24 hours) at 01/21/2018 0943 Last data filed at 01/21/2018 0621 Gross per 24 hour  Intake 900.31 ml  Output 1875 ml  Net -974.69 ml   Filed Weights   01/19/18 0358 01/20/18 0550 01/21/18 0621  Weight: 63.7 kg 63.4 kg 63.8 kg    Telemetry    Atrial flutter CVR, occ brady into upper 40s - Personally Reviewed  Physical Exam   GEN: No acute distress.  HEENT: Normocephalic, atraumatic, sclera non-icteric. Neck: No JVD or bruits. Cardiac: Irregularly irregular, no murmurs, rubs, or gallops.  Radials/DP/PT 1+ and equal bilaterally.  Respiratory: Clear to auscultation bilaterally. Breathing is unlabored. GI: Soft, nontender, non-distended, BS +x 4. MS: L antecubital ecchymosis extending down L mid forearm related to prior IV blood draw, no warmth, suppuration or tenseness Extremities: No clubbing or cyanosis. No edema. Distal pedal  pulses are 2+ and equal bilaterally. Neuro:  AAOx3. Follows commands. Psych:  Responds to questions appropriately with a normal affect.  Labs    Chemistry Recent Labs  Lab 01/18/18 1103 01/19/18 0343  NA 139 140  K 4.2 4.0  CL 106 107  CO2 27 24  GLUCOSE 115* 85  BUN 18 16  CREATININE 1.26* 1.13  CALCIUM 9.0 8.8*  PROT 6.5  --   ALBUMIN 3.9  --   AST 29  --   ALT 22  --   ALKPHOS 36*  --   BILITOT 0.9  --   GFRNONAA 56* >60  GFRAA >60 >60  ANIONGAP 6 9     Hematology Recent Labs  Lab 01/19/18 0343 01/20/18 0430 01/21/18 0710  WBC 5.3 5.8 5.3  RBC 3.74* 3.88* 3.89*  HGB 12.5* 12.5* 12.6*  HCT 37.3* 38.6* 39.0  MCV 99.7 99.5 100.3*  MCH 33.4 32.2 32.4  MCHC 33.5 32.4 32.3  RDW 11.9 11.9 12.1  PLT 270 264 256    Cardiac Enzymes Recent Labs  Lab 01/18/18 2205 01/19/18 0343  TROPONINI 1.62* 0.85*    Recent Labs  Lab 01/18/18 1240 01/18/18 1452  TROPIPOC 0.19* 0.55*     Radiology    No results found.  Cardiac Studies   2D echo 01/19/18 Study Conclusions  - Left ventricle: The cavity   size was normal. Wall thickness was   normal. Systolic function was normal. The estimated ejection   fraction was in the range of 50% to 55%. Wall motion was normal;   there were no regional wall motion abnormalities. The study is   not technically sufficient to allow evaluation of LV diastolic   function. - Left atrium: The atrium was mildly dilated. - Right atrium: The atrium was mildly dilated.  Impressions:  - Normal LV systolic function; mild biatrial enlargement; mild TR.   Patient Profile     73 y.o. male with history of HLD, urinary retention, probable CKD III presented to PCP with new onset chest pressure, SOB, and diaphoresis - found to be in atrial flutter with variable AV block and NSTEMI with peak troponin 1.62  Assessment & Plan    1. NSTEMI - continue ASA, heparin. No BB given bradycardia. Now on statin. Plan cath today. Risks/benefits  discussed with pt yesterday; reiterated today. He is agreeable to proceed. LVEF normal by echo 1/4.  2. New onset atrial flutter - rate controlled in absence of any AV blocking agents. Continue IV heparin. Chadsvasc is currently 1 for age but may be 2 if CAD is noted. Prior MD notes indicate recommendation to begin Eliquis or Xarelto post cath for consideration of DCCV. He is now asymptomatic so it would be reasonable to pursue as OP after 3 weeks of uninterrupted anticoagulation. Will also check thyroid function for baseline.  3. Hypotension - nadir 83/62. Probably baseline for patient. Recheck BP 114 systolic this AM, asymptomatic.  4. Hyperlipidemia - started on statin this admission. If the patient is tolerating statin at time of follow-up appointment, would consider rechecking liver function/lipid panel in 6-8 weeks.  5. Probable CKD III - stable on recheck 1/4. Repeat in AM.   For questions or updates, please contact [facility] Please consult [email or web address] for contact info under Cardiology/STEMI.  Signed, Laurann Montanaayna N Ursala Cressy, PA-C 01/21/2018, 9:43 AM

## 2018-01-21 NOTE — Interval H&P Note (Signed)
History and Physical Interval Note:  01/21/2018 5:20 PM  Richard Perkins  has presented today for cardiac catheterization, with the diagnosis of NSTEMI  The various methods of treatment have been discussed with the patient and family. After consideration of risks, benefits and other options for treatment, the patient has consented to  Procedure(s): LEFT HEART CATH AND CORONARY ANGIOGRAPHY (N/A) as a surgical intervention .  The patient's history has been reviewed, patient examined, no change in status, stable for surgery.  I have reviewed the patient's chart and labs.  Questions were answered to the patient's satisfaction.    Cath Lab Visit (complete for each Cath Lab visit)  Clinical Evaluation Leading to the Procedure:   ACS: Yes.    Non-ACS:  N/A  [name]

## 2018-01-21 NOTE — Progress Notes (Signed)
ANTICOAGULATION CONSULT NOTE  Pharmacy Consult for Heparin Indication: chest pain/ACS and atrial fibrillation  No Known Allergies  Patient Measurements: Height: 5\' 8"  (172.7 cm) Weight: 140 lb 9.6 oz (63.8 kg) IBW/kg (Calculated) : 68.4  Vital Signs: Temp: 97.8 F (36.6 C) (01/06 0624) Temp Source: Oral (01/06 0624) BP: 114/68 (01/06 1035) Pulse Rate: 63 (01/06 0624)  Labs: Recent Labs    01/18/18 1103  01/18/18 2205 01/19/18 0343 01/20/18 0430 01/21/18 0710  HGB 13.1  --   --  12.5* 12.5* 12.6*  HCT 41.6  --   --  37.3* 38.6* 39.0  PLT 243  --   --  270 264 256  HEPARINUNFRC  --    < > 0.41 0.36 0.25* 0.58  CREATININE 1.26*  --   --  1.13  --   --   TROPONINI  --   --  1.62* 0.85*  --   --    < > = values in this interval not displayed.    Estimated Creatinine Clearance: 52.5 mL/min (by C-G formula based on SCr of 1.13 mg/dL).  Assessment: 73 y.o. male with chest pain and new onset a flutter  Anticoag: heparin for r/o ACS and also w/ aflutter (CHADSVASC=1-2). possible DOAC and cardioversion -HL= 0.58  Renal: SCr 1.13  Heme/Onc: H&H 12.6/39, Plt 256  Goal of Therapy:  Heparin level 0.3-0.7 units/ml Monitor platelets by anticoagulation protocol: Yes   Plan:  heparin 900 units/hr Daily HL/CBC F/U post cath plans and DOAC  Isaac Bliss, PharmD, BCPS, BCCCP Clinical Pharmacist [phone]  Please check AMION for all [facility] Pharm[facility] numbers  01/21/2018 10:59 AM

## 2018-01-21 NOTE — Brief Op Note (Signed)
BRIEF CARDIAC CATHETERIZATION NOTE  01/21/2018  6:00 PM  PATIENT:  Richard Perkins  73 y.o. male  PRE-OPERATIVE DIAGNOSIS:  NSTEMI, atrial flutter  POST-OPERATIVE DIAGNOSIS:  SAME  PROCEDURE:  Procedure(s): LEFT HEART CATH AND CORONARY ANGIOGRAPHY (N/A)  SURGEON:  Surgeon(s) and Role:    * Laurice Iglesia, Cristal Deer, MD - Primary  FINDINGS: 1. No angiographically significant coronary artery disease. 2. Normal LVEF and LVEDP.  RECOMMENDATIONS: 1. Primary prevention of CAD. 2. Management of atrial flutter per general cardiology team.  Restart heparin infusion 2 hours after TR band deflation.  Consider transitioning to NOAC tomorrow if no evidence of bleeding or vascular injury at catheterization site.  Yvonne Kendall, MD [facility] Pager: [phone]

## 2018-01-21 NOTE — Progress Notes (Signed)
ANTICOAGULATION CONSULT NOTE  Pharmacy Consult for Heparin Indication: chest pain/ACS and atrial fibrillation  No Known Allergies  Patient Measurements: Height: 5\' 8"  (172.7 cm) Weight: 140 lb 9.6 oz (63.8 kg) IBW/kg (Calculated) : 68.4  Vital Signs: BP: 104/51 (01/06 2045) Pulse Rate: 70 (01/06 1921)  Labs: Recent Labs    01/18/18 2205  01/19/18 0343 01/20/18 0430 01/21/18 0710  HGB  --    < > 12.5* 12.5* 12.6*  HCT  --   --  37.3* 38.6* 39.0  PLT  --   --  270 264 256  HEPARINUNFRC 0.41  --  0.36 0.25* 0.58  CREATININE  --   --  1.13  --   --   TROPONINI 1.62*  --  0.85*  --   --    < > = values in this interval not displayed.    Estimated Creatinine Clearance: 52.5 mL/min (by C-G formula based on SCr of 1.13 mg/dL).  Assessment: 72 yom with chest pain and new onset Aflutter. Underwent cardiac cath on 1/6 finding no significant CAD and normal LVEF. Given atrial flutter, plan to restart heparin infusion 2 hours after TR band deflation (occurred on 1/6@2130 ), with possible plans for NOAC transition.   Heparin level previously therapeutic on 900 units/hr. No s/sx of bleeding. Hgb 12.6, plt 256.   Goal of Therapy:  Heparin level 0.3-0.7 units/ml Monitor platelets by anticoagulation protocol: Yes   Plan:  Restart heparin infusion at 900 units/hr on 1/6@2330  Daily HL/CBC F/U plans for DOAC  Sherron Monday, PharmD, BCCCP Clinical Pharmacist  Pager: [phone] Phone: [phone] Please check AMION for all [facility] Pharmacy numbers 01/21/2018 9:30 PM

## 2018-01-22 ENCOUNTER — Encounter (HOSPITAL_COMMUNITY): Payer: Self-pay | Admitting: Internal Medicine

## 2018-01-22 DIAGNOSIS — I4892 Unspecified atrial flutter: Secondary | ICD-10-CM | POA: Diagnosis present

## 2018-01-22 DIAGNOSIS — N182 Chronic kidney disease, stage 2 (mild): Secondary | ICD-10-CM | POA: Diagnosis present

## 2018-01-22 DIAGNOSIS — R339 Retention of urine, unspecified: Secondary | ICD-10-CM | POA: Diagnosis present

## 2018-01-22 DIAGNOSIS — E785 Hyperlipidemia, unspecified: Secondary | ICD-10-CM | POA: Diagnosis present

## 2018-01-22 DIAGNOSIS — I959 Hypotension, unspecified: Secondary | ICD-10-CM | POA: Diagnosis present

## 2018-01-22 LAB — CBC
HCT: 36.3 % — ABNORMAL LOW (ref 39.0–52.0)
Hemoglobin: 11.8 g/dL — ABNORMAL LOW (ref 13.0–17.0)
MCH: 32.6 pg (ref 26.0–34.0)
MCHC: 32.5 g/dL (ref 30.0–36.0)
MCV: 100.3 fL — ABNORMAL HIGH (ref 80.0–100.0)
Platelets: 224 10*3/uL (ref 150–400)
RBC: 3.62 MIL/uL — ABNORMAL LOW (ref 4.22–5.81)
RDW: 11.9 % (ref 11.5–15.5)
WBC: 4.8 10*3/uL (ref 4.0–10.5)
nRBC: 0 % (ref 0.0–0.2)

## 2018-01-22 LAB — BASIC METABOLIC PANEL
Anion gap: 8 (ref 5–15)
BUN: 13 mg/dL (ref 8–23)
CO2: 26 mmol/L (ref 22–32)
Calcium: 8.6 mg/dL — ABNORMAL LOW (ref 8.9–10.3)
Chloride: 108 mmol/L (ref 98–111)
Creatinine, Ser: 1.22 mg/dL (ref 0.61–1.24)
GFR calc Af Amer: >60 mL/min (ref >60)
GFR calc non Af Amer: 58 mL/min — ABNORMAL LOW (ref >60)
Glucose, Bld: 92 mg/dL (ref 70–99)
Potassium: 4.3 mmol/L (ref 3.5–5.1)
Sodium: 142 mmol/L (ref 135–145)

## 2018-01-22 LAB — T4, FREE: Free T4: 0.69 ng/dL — ABNORMAL LOW (ref 0.82–1.77)

## 2018-01-22 LAB — HEPARIN LEVEL (UNFRACTIONATED): HEPARIN UNFRACTIONATED: 0.19 [IU]/mL — AB (ref 0.30–0.70)

## 2018-01-22 LAB — GLUCOSE, CAPILLARY: Glucose-Capillary: 80 mg/dL (ref 70–99)

## 2018-01-22 LAB — TSH: TSH: 2.902 u[IU]/mL (ref 0.350–4.500)

## 2018-01-22 MED ORDER — APIXABAN 5 MG PO TABS: 5.0000 mg | ORAL_TABLET | Freq: Two times a day (BID) | ORAL | 0 refills | Status: DC

## 2018-01-22 MED ORDER — ASPIRIN 81 MG PO TBEC: 81.0000 mg | DELAYED_RELEASE_TABLET | Freq: Every day | ORAL | 6 refills | Status: DC

## 2018-01-22 MED ORDER — APIXABAN 5 MG PO TABS
5.0000 mg | ORAL_TABLET | Freq: Two times a day (BID) | ORAL | Status: DC
  Administered 2018-01-22: 5 mg via ORAL
  Filled 2018-01-22: qty 1

## 2018-01-22 MED ORDER — APIXABAN 5 MG PO TABS: 5.0000 mg | ORAL_TABLET | Freq: Two times a day (BID) | ORAL | 6 refills | Status: DC

## 2018-01-22 MED ORDER — ATORVASTATIN CALCIUM 40 MG PO TABS: 40.0000 mg | ORAL_TABLET | Freq: Every day | ORAL | Status: DC

## 2018-01-22 MED ORDER — ATORVASTATIN CALCIUM 40 MG PO TABS: 40.0000 mg | ORAL_TABLET | Freq: Every evening | ORAL | 6 refills | Status: AC

## 2018-01-22 NOTE — Discharge Instructions (Addendum)
Atrial Flutter  Atrial flutter is a type of abnormal heart rhythm (arrhythmia). The heart has an electrical system that tells the heart how to beat. In atrial flutter, the signals move rapidly in the top chambers of the heart (the atria). This makes your heart beat very fast. Atrial flutter can come and go, or it can be permanent. If this condition is not treated it can cause serious complications, such as stroke or weakened heart muscle (cardiomyopathy). What are the causes? This condition may be caused by:  A heart condition or problem, such as: ? A heart attack. ? Heart failure. ? A heart valve problem. ? Heart surgery.  A lung problem, such as: ? A blood clot in the lungs (pulmonary embolism, or PE). ? Chronic obstructive pulmonary disease.  Poorly controlled high blood pressure (hypertension).  Overactive thyroid (hyperthyroidism).  Caffeine.  Some decongestant cold medicines.  Low levels of minerals called electrolytes in the blood.  Cocaine. What increases the risk? You are more likely to develop this condition if:  You are an elderly adult.  You are a man.  You are obese.  You have obstructive sleep apnea.  You have a family history of atrial flutter.  You have diabetes. What are the signs or symptoms? Symptoms of this condition include:  A feeling that your heart is pounding or racing (palpitations).  Shortness of breath.  Chest pain.  Feeling light-headed.  Dizziness.  Fainting.  Low blood pressure (hypotension).  Fatigue. Sometimes there are no symptoms associated with arrhythmia. How is this diagnosed? This condition may be diagnosed based on:  An electrocardiogram (ECG). This is a test that records the electrical signals in the heart.  Ambulatory cardiac monitoring. This is a small recording device that is connected by wires to flat, sticky disks (electrodes) that are attached to your chest.  An echocardiogram. This is a test that  uses sound waves to create pictures of your heart.  A transesophageal echocardiogram (TEE). In this test, a device is placed down your esophagus. This device then uses sounds waves to create even closer pictures of your heart.  Stress test. This test records your heartbeat while you exercise and checks to see if the heart muscle is receiving adequate blood supply. How is this treated? This condition may be treated with:  Medicines to: ? Make your heart beat more slowly. ? Keep your heart in normal rhythm. ? Prevent a stroke.  Cardioversion. This uses medicines or an electrical shock to make the heart beat normally.  Ablation. This destroys the heart tissue that is causing the problem. In some cases, your health care provider will treat other underlying conditions. Follow these instructions at home: Medicines  Take over-the-counter and prescription medicines only as told by your health care provider. ? Make sure you take your medicines exactly as told by your health care provider. ? Richard Perkins not miss any doses.  Richard Perkins not take any new medicines without talking to your health care provider. Lifestyle  Eat heart-healthy foods. Talk with a dietitian to make an eating plan that is right for you.  Richard Perkins not use any products that contain nicotine or tobacco, such as cigarettes and e-cigarettes. If you need help quitting, ask your health care provider.  Limit alcohol intake to no more than 1 drink per Perkins for nonpregnant women and 2 drinks per Perkins for men. One drink equals 12 oz of beer, 5 oz of wine, or 1 oz of hard liquor.  Try to reduce  any stress. Stress can make your symptoms worse.  Get screened for sleep apnea. If you have the condition, work with your health care provider to find a treatment that works for you.  Richard Perkins not use drugs.  Avoid excessive caffeine. General instructions  Lose weight if your health care provider tells you to Richard Perkins that.  Keep all follow-up visits as told by your  health care provider. This is important. Contact a health care provider if:  Your symptoms get worse.  You notice that your palpitations are increasing. Get help right away if:  You have any symptoms of a stroke. "BE FAST" is an easy way to remember the main warning signs of a stroke: ? B - Balance. Signs are dizziness, sudden trouble walking, or loss of balance. ? E - Eyes. Signs are trouble seeing or a sudden change in vision. ? F - Face. Signs are sudden weakness or numbness of the face, or the face or eyelid drooping on one side. ? A - Arms. Signs are weakness or numbness in an arm. This happens suddenly and usually on one side of the body. ? S - Speech. Signs are sudden trouble speaking, slurred speech, or trouble understanding what people say. ? T - Time. Time to call emergency services. Write down what time symptoms started.  You have other signs of a stroke, such as: ? A sudden, severe headache with no known cause. ? Nausea or vomiting. ? Seizure.  You have additional symptoms, such as: ? Fainting. ? Shortness of breath. ? Pain or pressure in your chest. ? Suddenly feeling nauseous or suddenly vomiting. ? Increased sweating with no known cause.  These symptoms may represent a serious problem that is an emergency. Richard Perkins not wait to see if the symptoms will go away. Get medical help right away. Call your local emergency services (911 in the [place] Richard Perkins not drive yourself to the hospital. Summary  Atrial flutter is an abnormal heart rhythm that can give you symptoms of palpitations, shortness of breath, or fatigue.  Atrial flutter is often treated with medicines to keep your heart in a normal rhythm and to prevent a stroke.  You should seek immediate help if you cannot catch your breath, have chest pain or pressure, or have weakness, especially on one side of your body. This information is not intended to replace advice given to you by your health care provider. Make sure you  discuss any questions you have with your health care provider.  Information on my medicine - ELIQUIS (apixaban)  Why was Eliquis prescribed for you? Eliquis was prescribed for you to reduce the risk of a blood clot forming that can cause a stroke if you have a medical condition called atrial fibrillation (a type of irregular heartbeat).  What Richard Perkins You need to know about Eliquis ? Take your Eliquis TWICE DAILY - one tablet in the morning and one tablet in the evening with or without food. If you have difficulty swallowing the tablet whole please discuss with your pharmacist how to take the medication safely.  Take Eliquis exactly as prescribed by your doctor and Richard Perkins NOT stop taking Eliquis without talking to the doctor who prescribed the medication.  Stopping may increase your risk of developing a stroke.  Refill your prescription before you run out.  After discharge, you should have regular check-up appointments with your healthcare provider that is prescribing your Eliquis.  In the future your dose may need to be changed if your kidney function  or weight changes by a significant amount or as you get older.  What Richard Perkins you Richard Perkins if you miss a dose? If you miss a dose, take it as soon as you remember on the same Perkins and resume taking twice daily.  Richard Perkins not take more than one dose of ELIQUIS at the same time to make up a missed dose.  Important Safety Information A possible side effect of Eliquis is bleeding. You should call your healthcare provider right away if you experience any of the following: ? Bleeding from an injury or your nose that does not stop. ? Unusual colored urine (red or dark brown) or unusual colored stools (red or black). ? Unusual bruising for unknown reasons. ? A serious fall or if you hit your head (even if there is no bleeding).  Some medicines may interact with Eliquis and might increase your risk of bleeding or clotting while on Eliquis. To help avoid this, consult  your healthcare provider or pharmacist prior to using any new prescription or non-prescription medications, including herbals, vitamins, non-steroidal anti-inflammatory drugs (NSAIDs) and supplements.  This website has more information on Eliquis (apixaban): http://www.eliquis.com/eliquis/home

## 2018-01-22 NOTE — Progress Notes (Addendum)
Progress Note  Patient Name: Karn PicklerWilliam A Eicher Date of Encounter: 01/22/2018  Primary Cardiologist: Armanda Magicraci Turner, MD  Subjective   Feeling great today. Enjoyed seeing how the TR band worked yesterday. No CP, SOB, dizziness, palpitations.   Inpatient Medications    Scheduled Meds: . aspirin EC  81 mg Oral Daily  . atorvastatin  80 mg Oral q1800  . sodium chloride flush  3 mL Intravenous Q12H  . tamsulosin  0.4 mg Oral QHS   Continuous Infusions: . sodium chloride    . heparin 900 Units/hr (01/22/18 0518)   PRN Meds: sodium chloride, acetaminophen, ondansetron (ZOFRAN) IV, sodium chloride flush   Vital Signs    Vitals:   01/21/18 2045 01/21/18 2130 01/21/18 2211 01/22/18 0538  BP: (!) 104/51 95/60 (!) 88/52 102/62  Pulse:    (!) 59  Resp:    18  Temp:    (!) 97.4 F (36.3 C)  TempSrc:    Oral  SpO2:    96%  Weight:    64.9 kg  Height:        Intake/Output Summary (Last 24 hours) at 01/22/2018 0855 Last data filed at 01/22/2018 0548 Gross per 24 hour  Intake 1569.35 ml  Output 1600 ml  Net -30.65 ml   Filed Weights   01/20/18 0550 01/21/18 0621 01/22/18 0538  Weight: 63.4 kg 63.8 kg 64.9 kg    Telemetry    Atrial flutter HR upper 40s-50s - Personally Reviewed  Physical Exam   GEN: No acute distress.  HEENT: Normocephalic, atraumatic, sclera non-icteric. Neck: No JVD or bruits. Cardiac: Irregularly irregular, no murmurs, rubs, or gallops.  Radials/DP/PT 1+ and equal bilaterally.  Respiratory: Clear to auscultation bilaterally. Breathing is unlabored. GI: Soft, nontender, non-distended, BS +x 4. MS: no deformity. Extremities: No clubbing or cyanosis. No edema. Distal pedal pulses are 2+ and equal bilaterally. Right radial cath site without hematoma or ecchymosis; good pulse. Neuro:  AAOx3. Follows commands. Psych:  Responds to questions appropriately with a normal affect.  Labs    Chemistry Recent Labs  Lab 01/18/18 1103 01/19/18 0343  01/22/18 0714  NA 139 140 142  K 4.2 4.0 4.3  CL 106 107 108  CO2 27 24 26   GLUCOSE 115* 85 92  BUN 18 16 13   CREATININE 1.26* 1.13 1.22  CALCIUM 9.0 8.8* 8.6*  PROT 6.5  --   --   ALBUMIN 3.9  --   --   AST 29  --   --   ALT 22  --   --   ALKPHOS 36*  --   --   BILITOT 0.9  --   --   GFRNONAA 56* >60 58*  GFRAA >60 >60 >60  ANIONGAP 6 9 8      Hematology Recent Labs  Lab 01/20/18 0430 01/21/18 0710 01/22/18 0714  WBC 5.8 5.3 4.8  RBC 3.88* 3.89* 3.62*  HGB 12.5* 12.6* 11.8*  HCT 38.6* 39.0 36.3*  MCV 99.5 100.3* 100.3*  MCH 32.2 32.4 32.6  MCHC 32.4 32.3 32.5  RDW 11.9 12.1 11.9  PLT 264 256 224    Cardiac Enzymes Recent Labs  Lab 01/18/18 2205 01/19/18 0343  TROPONINI 1.62* 0.85*    Recent Labs  Lab 01/18/18 1240 01/18/18 1452  TROPIPOC 0.19* 0.55*     BNPNo results for input(s): BNP, PROBNP in the last 168 hours.   DDimer No results for input(s): DDIMER in the last 168 hours.   Radiology    No results  found.  Cardiac Studies   2d echo 01/19/18 Study Conclusions  - Left ventricle: The cavity size was normal. Wall thickness was   normal. Systolic function was normal. The estimated ejection   fraction was in the range of 50% to 55%. Wall motion was normal;   there were no regional wall motion abnormalities. The study is   not technically sufficient to allow evaluation of LV diastolic   function. - Left atrium: The atrium was mildly dilated. - Right atrium: The atrium was mildly dilated.  Impressions:  - Normal LV systolic function; mild biatrial enlargement; mild TR.  Cath 01/21/18 Conclusions: 1. Minimal luminal irregularities involving the LAD.  No angiographically significant coronary artery disease. 2. Normal left ventricular systolic function and filling pressure.  Recommendations: 1. Primary prevention of coronary artery disease. 2. Management of atrial flutter per general cardiology team. Restart heparin infusion 2 hours  after TR band deflation. Consider transitioning to NOAC tomorrow if no evidence of bleeding or vascular injury at catheterization site.  Patient Profile     73 y.o. male  with history of HLD, urinary retention, probable CKD III presented to PCP with new onset chest pressure, SOB, and diaphoresis - found to be in atrial flutter with variable AV block and NSTEMI with peak troponin 1.62  Assessment & Plan    1. NSTEMI -> [place] - peak troponin 1.62, LHC yesterday without obvious cause - only minimal luminal irregularities. Patient remains asymptomatic at present time. Will ambulate to assess for any hypoxia but otherwise no symptoms to suggest alternative cause such as  PE. Coronary vasospasm would be in the differential; BP seems too low to provide standing medical therapy. Would probably not initiate Plavix for medical therapy of MI given lack of CAD and need for NOAC. Will discuss plan for ASA and statin dosing with Dr. Rennis Golden given lack of CAD. As part of ACTION registry criteria will get cardiac rehab to see for general cardiac education and ambulation.   2. New onset atrial flutter - rate controlled in absence of any AV blocking agents. Continue IV heparin. CHADSVASC is currently 1 for age - only minimal luminal irregs on cath but no significant CAD. When he hits 74, he will score a 2 which will affect long term plan. DC heparin and start Eliquis per pharmacy for education. Consult care management for any PA needs. Anticipate OP f/u and DCCV after 3-4 weeks of uninterrupted anticoagulation. He may be a candidate for ablation.  3. Hypotension - nadir 83/62 yesterday. Probably baseline for patient based on his report (90-100 systolic even before hospitalization). Stable today. Asymptomatic. TSH is normal but free T4 slightly low. Can f/u PCP for this. No other sx to suggest something like adrenal insufficiency.  4. Hyperlipidemia - started on statin this admission. As above, will review plan for  dose with MD.  5. 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   Pt does not wish to use TOC pharmacy.   Anticipate DC today - have scheduled f/u 02/14/18 at 11am with B. [employer]. This will be just over 3 weeks on anticoagulation to discuss DCCV. It does not seem clear to me that he would require the 1-2 week follow-up TOC for elevated troponin, but will clarify recs with MD.  For questions or updates, please contact [facility] Please consult [email or web address] for contact info under Cardiology/STEMI.  Signed, Laurann Montana, PA-C 01/22/2018, 8:55 AM

## 2018-01-22 NOTE — Care Management (Signed)
Co-pay amount for Eliquis (apixaban)  5mg . twice a day for 30 day supply $38.70. No PA required Spoke to phamacy at [facility]

## 2018-01-22 NOTE — Progress Notes (Signed)
Pt stable, discharged home with daughter. RN walked pt out.

## 2018-01-22 NOTE — Progress Notes (Addendum)
ANTICOAGULATION CONSULT NOTE  Pharmacy Consult for Heparin Indication: chest pain/ACS and atrial fibrillation  No Known Allergies  Patient Measurements: Height: 5\' 8"  (172.7 cm) Weight: 143 lb 1.6 oz (64.9 kg) IBW/kg (Calculated) : 68.4  Vital Signs: Temp: 97.4 F (36.3 C) (01/07 0538) Temp Source: Oral (01/07 0538) BP: 102/62 (01/07 0538) Pulse Rate: 59 (01/07 0538)  Labs: Recent Labs    01/20/18 0430 01/21/18 0710 01/22/18 0714  HGB 12.5* 12.6* 11.8*  HCT 38.6* 39.0 36.3*  PLT 264 256 224  HEPARINUNFRC 0.25* 0.58 0.19*  CREATININE  --   --  1.22    Estimated Creatinine Clearance: 49.5 mL/min (by C-G formula based on SCr of 1.22 mg/dL).  Assessment: 72 yom with chest pain and new onset Aflutter. Underwent cardiac cath on 1/6 finding no significant CAD and normal LVEF. Given atrial flutter her was restarted on heparin with possible plans for NOAC transition.  -heparin level =0.19, CBC stable   Goal of Therapy:  Heparin level 0.3-0.7 units/ml Monitor platelets by anticoagulation protocol: Yes   Plan:  Increase heparin to 1100 units/hr Heparin level in 6 hours and daily wth CBC daily F/U plans for DOAC  Harland German, PharmD Clinical Pharmacist **Pharmacist phone directory can now be found on [email or web address] ([email or web address]).  Listed under [facility].  Addendum -change to apixiban -heparin has been discontinued  Plan -apixaban 5mg  po bid -will provide patient education  Harland German, PharmD Clinical Pharmacist **Pharmacist phone directory can now be found on [email or web address] ([email or web address]).  Listed under [facility].

## 2018-01-22 NOTE — Discharge Summary (Addendum)
Discharge Summary    Patient ID: Richard Perkins,  MRN: [id number], DOB/AGE: 74-Oct-1947 74 y.o.  Admit date: 01/18/2018 Discharge date: 01/22/2018  Primary Care Provider: Daisy Floro Primary Cardiologist: Armanda Magic, MD Primary Electrophysiologist:  None  Discharge Diagnoses    Principal Problem:   NSTEMI (non-ST elevated myocardial infarction) [facility]) Active Problems:   Elevated troponin   Typical atrial flutter (HCC)   Atrial flutter (HCC)   CKD (chronic kidney disease), stage II   Hyperlipidemia   Hypotension   Urinary retention   Diagnostic Studies/Procedures    2D echo 01/19/18 Study Conclusions  - Left ventricle: The cavity size was normal. Wall thickness was   normal. Systolic function was normal. The estimated ejection   fraction was in the range of 50% to 55%. Wall motion was normal;   there were no regional wall motion abnormalities. The study is   not technically sufficient to allow evaluation of LV diastolic   function. - Left atrium: The atrium was mildly dilated. - Right atrium: The atrium was mildly dilated. Impressions: - Normal LV systolic function; mild biatrial enlargement; mild TR.  Cath 01/21/18 Conclusions: 1. Minimal luminal irregularities involving the LAD.  No angiographically significant coronary artery disease. 2. Normal left ventricular systolic function and filling pressure.  Recommendations: 1. Primary prevention of coronary artery disease. 2. Management of atrial flutter per general cardiology team. Restart heparin infusion 2 hours after TR band deflation. Consider transitioning to NOAC tomorrow if no evidence of bleeding or vascular injury at catheterization site.  Yvonne Kendall, MD [facility] Pager: [phone]   _____________     History of Present Illness     Richard Perkins is a 73 y.o. 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 He did report increased SOB with less activity over the last year but without significant functional limitation. On day of admission he was in USOH until he experienced chest pressure, SOB, and diaphoresis shortly after waking around 6:30am. He reported doing some breathing exercises at home which improved his symptoms but did not resolve them. He presented to his PCP office for these complaints and was found to be in atrial flutter on EKG. He was transferred to the ED for further evaluation. In the ER he felt back to his usual self without further chest discomfort.   Hospital Course   1. Chest pain withNSTEMI -> MINOCA- he ruled in for NSTEMI with peak troponin 1.62. He was started on IV heparin both for his new onset atrial flutter as well as MI. He was also started on ASA and statin. His HR was in the upper 40s-60s at rest so beta blocker was not started. 2D echo 01/19/18 showed EF 50-55%, normal wall motion, mild LAE/RAE. He underwent cardiac cath 01/21/18 showing no significant CAD, only minimal luminal irregularities. Cause of his MI is not totally clear. He may have had spontaneous plaque rupture and recanalization. In addition he could have had spasm. His BP remains too low to add any empiric anti-spasm medications, but we will continue ASA in addition to his new NOAC. He was also started on statin for endothelial stabilization as well. Plavix was not added given need for NOAC. He was seen by cardiac rehab and is very eager to get back into normal activity. He ambulated  with cardiac rehab and remained 100% on RA with their activity. He has not been tachycardic and denies any pleuritic chest discomfort or lower extremity swelling. NTG was not prescribed given tendency for low baseline BP.  2. New onset atrial flutter - rate controlled in absence of any AV blocking agents. He was started on IV heparin pre-cath  and transitioned to Eliquis today. CHADSVASC is 1 for age, but possibly 2 if you count the possible coronary artery event this admission. In 2 years he will gain an additional point. He is totally asymptomatic today. For now the plan is to minimum 3 weeks of uninterrupted anticoagulation on Eliquis and consider outpatient cardioversion after follow-up. Received 30 day free Eliquis card this admission.  3. Hypotension- appears chronic for patient, nadir 83/62 yesterday. Probably baseline for patient based on his report (90-100 systolic even before hospitalization). Stable today. Asymptomatic. TSH is normal but free T4 slightly low. Can f/u PCP for this. No other sx to suggest something like adrenal insufficiency.  4. Hyperlipidemia- started on statin this admission. If the patient is tolerating statin at time of follow-up appointment, would consider rechecking liver function/lipid panel in 6-8 weeks.  5. 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   F/u has been arranged as below. Dr Rennis GoldenHilty has seen and examined the patient today and feels he is stable for discharge. 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 _____________  Discharge Vitals Blood pressure 102/62, pulse (!) 59, temperature (!) 97.4 F (36.3 C), temperature source Oral, resp. rate 18, height 5\' 8"  (1.727 m), weight 64.9 kg, SpO2 96 %.  Filed Weights   01/20/18 0550 01/21/18 0621 01/22/18 0538  Weight: 63.4 kg 63.8 kg 64.9 kg    Labs & Radiologic Studies    CBC Recent Labs    01/21/18 0710 01/22/18 0714  WBC 5.3 4.8  HGB 12.6* 11.8*  HCT 39.0 36.3*  MCV 100.3* 100.3*  PLT 256 224   Basic Metabolic Panel Recent Labs    13/08/6499/07/20 0714  NA 142  K 4.3  CL 108  CO2 26  GLUCOSE 92  BUN 13  CREATININE 1.22  CALCIUM 8.6*   Thyroid Function Tests Recent Labs    01/22/18 0714  TSH 2.902   _____________   Dg Chest Portable 1 View  Result Date: 01/18/2018 CLINICAL DATA:  73 year old male with left chest heaviness and shortness of breath on waking. Atrial fibrillation at presentation. EXAM: PORTABLE CHEST 1 VIEW COMPARISON:  None. FINDINGS: Portable AP semi upright view at 1108 hours. Mild cardiomegaly. Other mediastinal contours are within normal limits. Visualized tracheal air column is within normal limits. Upper limits of normal to mildly hyperinflated lung volumes. Allowing for portable technique the lungs are clear. No pneumothorax. No acute osseous abnormality identified. IMPRESSION: Mild cardiomegaly.  No acute pulmonary abnormality. Electronically Signed   By: Odessa FlemingH  Hall M.D.   On: 01/18/2018 11:20   Disposition   Pt is being discharged home today in good condition.  Follow-up Plans & Appointments    Follow-up Information    Allayne ButcherSimmons, Brittainy M, PA-C Follow up.   Specialties:  Cardiology, Radiology Why:  [facility] - see appointment below with Brittainy on 02/14/2018. Avon GullyBrittainy is one of the PAs that works closely with our cardiology team.  Contact information: [address] [place] [address] [phone]          Discharge  Instructions    Amb Referral to Cardiac Rehabilitation   Complete by:  As directed    Diagnosis:  NSTEMI   Diet - low sodium heart healthy   Complete by:  As directed    Increase activity slowly   Complete by:  As directed    No driving for 1 week. No lifting over 10 lbs for 2 weeks. No sexual activity for 2 weeks. Keep procedure site clean & dry. If you notice increased pain, swelling, bleeding or pus, call/return!  You may shower, but no soaking baths/hot tubs/pools for 1 week.   If you notice any bleeding such as blood in stool, black tarry stools, blood in urine, nosebleeds or any other unusual bleeding, call your doctor immediately. It is not normal to have this kind of bleeding while on a blood thinner and usually indicates there is  an underlying problem with one of your body systems that needs to be checked out.      Discharge Medications   Allergies as of 01/22/2018   No Known Allergies     Medication List    TAKE these medications   apixaban 5 MG Tabs tablet Commonly known as:  ELIQUIS Take 1 tablet (5 mg total) by mouth 2 (two) times daily.   aspirin 81 MG EC tablet Take 1 tablet (81 mg total) by mouth daily. Start taking on:  January 23, 2018   atorvastatin 40 MG tablet Commonly known as:  LIPITOR Take 1 tablet (40 mg total) by mouth every evening.   cholecalciferol 1000 units tablet Commonly known as:  VITAMIN D Take 2,000 Units by mouth daily.   tamsulosin 0.4 MG Caps capsule Commonly known as:  FLOMAX Take 0.4 mg by mouth at bedtime.        Allergies:  No Known Allergies  Aspirin prescribed at discharge?  Yes High Intensity Statin Prescribed? (Lipitor 40-80mg  or Crestor 20-40mg ): Yes Beta Blocker Prescribed? No: bradycardia For EF <40%, was ACEI/ARB Prescribed? No: n/a ADP Receptor Inhibitor Prescribed? (i.e. Plavix etc.-Includes Medically Managed Patients): No: bleeding risk on NOAC too For EF <40%, Aldosterone Inhibitor Prescribed? No: n/a Was EF assessed during THIS hospitalization? Yes Was Cardiac Rehab II ordered? (Included Medically managed Patients): Yes   Outstanding Labs/Studies   N/a  Duration of Discharge Encounter   Greater than 30 minutes including physician time.  Signed, Tallis Soledad N Aqib Lough PA-C 01/22/2018, 1:20 PM

## 2018-01-22 NOTE — Plan of Care (Signed)
  Problem: Education: Goal: Knowledge of disease or condition will improve Outcome: Progressing   Problem: Health Behavior/Discharge Planning: Goal: Ability to safely manage health-related needs after discharge will improve Outcome: Progressing   Problem: Education: Goal: Knowledge of General Education information will improve Description Including pain rating scale, medication(s)/side effects and non-pharmacologic comfort measures Outcome: Progressing   Problem: Activity: Goal: Risk for activity intolerance will decrease Outcome: Progressing   Problem: Pain Managment: Goal: General experience of comfort will improve Outcome: Progressing   Problem: Nutrition: Goal: Adequate nutrition will be maintained Outcome: Completed/Met   Problem: Coping: Goal: Level of anxiety will decrease Outcome: Completed/Met   Problem: Safety: Goal: Ability to remain free from injury will improve Outcome: Completed/Met

## 2018-01-22 NOTE — Progress Notes (Signed)
CARDIAC REHAB PHASE I   PRE:  Rate/Rhythm: 66 aflutter    BP: sitting 106/66    SaO2: 100 RA  MODE:  Ambulation: 750 ft   POST:  Rate/Rhythm: 84 aflutter    BP: sitting 114/72     SaO2: 100 RA  Pt ambulated very quickly. Felt well. HR to 80s, BP stable. Discussed MI, Aflutter, ex gl, restrictions, and CRPII. Pt struggled with restrictions, even for 1 week. Encouraged walking daily for 1 week then he can resume his normal activity, keeping in mind sx. Will refer to CRPII as pt is thinking about it but probably will not do. Will refer to [facility].  [id number]  Harriet Masson CES, ACSM 01/22/2018 11:07 AM

## 2018-01-23 ENCOUNTER — Telehealth: Payer: Self-pay | Admitting: Physician Assistant

## 2018-01-23 NOTE — Telephone Encounter (Signed)
Patient wanted to know what MINOCA meant. He expressed understanding and had no further questions.

## 2018-01-23 NOTE — Telephone Encounter (Signed)
New Message           Patient was diagnosed  with something that starts with a "M". He couldn't remember the name of the condition Patient would like a call back  Concerning this matter.

## 2018-01-29 ENCOUNTER — Telehealth (HOSPITAL_COMMUNITY): Payer: Self-pay

## 2018-01-29 NOTE — Telephone Encounter (Signed)
Pt insurance is active and benefits verified through [facility]. Co-pay $20.00, DED $0.00/$0.00 met, out of pocket $4,000.00/$0.00 met, co-insurance 0%. No pre-authorization required. Passport, 01/29/2018 @ 213PM, REF# [id number]  Will contact patient to see if he is interested in the Cardiac Rehab Program. If interested, patient will need to complete follow up appt. Once completed, patient will be contacted for scheduling upon review by the RN Navigator.

## 2018-01-29 NOTE — Telephone Encounter (Signed)
Attempted to call patient in regards to Cardiac Rehab - LM on VM 

## 2018-01-31 ENCOUNTER — Telehealth (HOSPITAL_COMMUNITY): Payer: Self-pay

## 2018-01-31 NOTE — Telephone Encounter (Signed)
Pt returned CR phone call and stated he does not wish to participate at this time b/c he is working out at home.  Closed referral.

## 2018-02-13 ENCOUNTER — Encounter: Payer: Self-pay | Admitting: Cardiology

## 2018-02-14 ENCOUNTER — Ambulatory Visit: Payer: Medicare Other | Admitting: Cardiology

## 2018-02-27 ENCOUNTER — Encounter: Payer: Self-pay | Admitting: Cardiology

## 2018-02-27 ENCOUNTER — Ambulatory Visit: Payer: Medicare Other | Admitting: Cardiology

## 2018-02-27 ENCOUNTER — Encounter (INDEPENDENT_AMBULATORY_CARE_PROVIDER_SITE_OTHER): Payer: Self-pay

## 2018-02-27 VITALS — BP 106/64 | HR 47 | Ht 68.5 in | Wt 146.0 lb

## 2018-02-27 DIAGNOSIS — I483 Typical atrial flutter: Secondary | ICD-10-CM | POA: Diagnosis not present

## 2018-02-27 DIAGNOSIS — I214 Non-ST elevation (NSTEMI) myocardial infarction: Secondary | ICD-10-CM

## 2018-02-27 DIAGNOSIS — E782 Mixed hyperlipidemia: Secondary | ICD-10-CM

## 2018-02-27 NOTE — Progress Notes (Signed)
Cardiology Office Note:    Date:  02/27/2018   ID:  Richard Perkins, DOB 27-May-1945, MRN [id number]  PCP:  Richard Floro, MD  Cardiologist:  Richard Magic, MD  Referring MD: Richard Floro, MD   Chief Complaint  Patient presents with  . Hospitalization Follow-up    NSTEMI, atrial flutter    History of Present Illness:    Richard Perkins is a 73 y.o. male with a past medical history significant for Richard Perkins is a 73 y.o. 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   The patient was admitted to the hospital on 01/18/2018 experiencing chest pressure, shortness of breath and diaphoresis shortly after awakening around 6:30 AM.  He tried breathing exercises at home which did not ease his symptoms.  He presented to his PCP office and found to be in atrial flutter.  He was sent to the ED for further evaluation.  Troponins rose to 1.62 and was felt to rule in for NSTEMI.  He underwent cardiac cath on 01/21/2018 that showed no significant CAD, only minimal luminal irregularities.  It was felt that he may have had spontaneous plaque rupture and recanalization or possibly a vasospasm.  His blood pressure was running too low to empirically treat with antispasm medications.  He was started on low-dose aspirin, no Plavix in setting of need for anticoagulation.  He remained in atrial flutter with rate controlled on no AV blocking agents.  He was started on Eliquis for stroke risk reduction with CHADSVASC is 1 for age, but possibly 2 if you count the possible coronary artery event this admission.  He was totally asymptomatic with the atrial flutter.  He was planned to be on uninterrupted anticoagulation with Eliquis for a minimum of 3 weeks and consider outpatient cardioversion if a flutter continues.  The patient appears to have a  history of chronic hypotension.  Blood pressure in the hospital was 83/62 and bradycardia.  TSH was normal but free T4 was slightly low.  He was to follow-up with his PCP.  Statin was started during hospitalization.  Follow-up FLP and liver function today.   Richard Perkins is here today for follow up alone. He has had no further chest pressure, dyspnea. He does get DOE when he is riding up hill and has to downshift, but he feels that this is normal for the amount of exertion. He works out at [employer] and uses the machines without any pain. He may have a barely perceptible difference between his right and left chest but he thinks that he is being over analytical. He has had no palpitations, lightheadedness, orthopnea, PND or edema.  On the new meds, he notices easy bruising and very mild constipation. This is not bothersome enough to take something for it.   Past Medical History:  Diagnosis Date  . Atrial flutter (HCC)    a. dx 01/2018, rate controlled.  . CKD (chronic kidney disease), stage II   . Hyperlipidemia   . Hypotension    Appears baseline  . NSTEMI (non-ST elevated myocardial infarction) (HCC)    a. 01/2018 - cath without significant CAD, unclear etiology - ? transient plaque rupture with recanalization vs spasm.  Marland Kitchen Urinary retention     Past Surgical History:  Procedure Laterality Date  . BASAL CELL CARCINOMA EXCISION    .  COLONOSCOPY WITH PROPOFOL N/A 03/29/2015   Procedure: COLONOSCOPY WITH PROPOFOL;  Surgeon: Charolett Bumpers, MD;  Location: [facility] ENDOSCOPY;  Service: Endoscopy;  Laterality: N/A;  . KNEE ARTHROSCOPY W/ MENISCAL REPAIR    . LEFT HEART CATH AND CORONARY ANGIOGRAPHY N/A 01/21/2018   Procedure: LEFT HEART CATH AND CORONARY ANGIOGRAPHY;  Surgeon: Yvonne Kendall, MD;  Location: [facility] INVASIVE CV LAB;  Service: Cardiovascular;  Laterality: N/A;  . VARICOSE VEIN SURGERY      Current Medications: Current Meds  Medication Sig  . apixaban (ELIQUIS) 5 MG TABS tablet Take 1 tablet  (5 mg total) by mouth 2 (two) times daily.  Marland Kitchen aspirin EC 81 MG EC tablet Take 1 tablet (81 mg total) by mouth daily.  Marland Kitchen atorvastatin (LIPITOR) 40 MG tablet Take 1 tablet (40 mg total) by mouth every evening.  . cholecalciferol (VITAMIN D) 1000 units tablet Take 2,000 Units by mouth daily.  . tamsulosin (FLOMAX) 0.4 MG CAPS capsule Take 0.4 mg by mouth at bedtime.     Allergies:   Patient has no known allergies.   Social History   Socioeconomic History  . Marital status: Married    Spouse name: Not on file  . Number of children: Not on file  . Years of education: Not on file  . Highest education level: Not on file  Occupational History  . Not on file  Social Needs  . Financial resource strain: Not on file  . Food insecurity:    Worry: Not on file    Inability: Not on file  . Transportation needs:    Medical: Not on file    Non-medical: Not on file  Tobacco Use  . Smoking status: Never Smoker  . Smokeless tobacco: Never Used  Substance and Sexual Activity  . Alcohol use: Yes    Comment: wine  nightly  . Drug use: Not on file  . Sexual activity: Not on file  Lifestyle  . Physical activity:    Days per week: Not on file    Minutes per session: Not on file  . Stress: Not on file  Relationships  . Social connections:    Talks on phone: Not on file    Gets together: Not on file    Attends religious service: Not on file    Active member of club or organization: Not on file    Attends meetings of clubs or organizations: Not on file    Relationship status: Not on file  Other Topics Concern  . Not on file  Social History Narrative  . Not on file     Family History: The patient's family history includes Heart failure in his mother. ROS:   Please see the history of present illness.     All other systems reviewed and are negative.  EKGs/Labs/Other Studies Reviewed:    The following studies were reviewed today:  2D echo 01/19/18 Study Conclusions  - Left ventricle:  The cavity size was normal. Wall thickness was normal. Systolic function was normal. The estimated ejection fraction was in the range of 50% to 55%. Wall motion was normal; there were no regional wall motion abnormalities. The study is not technically sufficient to allow evaluation of LV diastolic function. - Left atrium: The atrium was mildly dilated. - Right atrium: The atrium was mildly dilated. Impressions: - Normal LV systolic function; mild biatrial enlargement; mild TR.  Cath 01/21/18 Conclusions: 1. Minimal luminal irregularities involving the LAD. No angiographically significant coronary artery disease. 2. Normal left  ventricular systolic function and filling pressure.  Recommendations: 1. Primary prevention of coronary artery disease. 2. Management of atrial flutter per general cardiology team. Restart heparin infusion 2 hours after TR band deflation. Consider transitioning to NOAC tomorrow if no evidence of bleeding or vascular injury at catheterization site.  Yvonne Kendall, MD  EKG:  EKG is ordered today.  The ekg ordered today demonstrates marked sinus bradycardia at 44 bpm.  Recent Labs: 01/18/2018: ALT 22 01/22/2018: BUN 13; Creatinine, Ser 1.22; Hemoglobin 11.8; Platelets 224; Potassium 4.3; Sodium 142; TSH 2.902   Recent Lipid Panel    Component Value Date/Time   CHOL 199 01/19/2018 0343   TRIG 90 01/19/2018 0343   HDL 62 01/19/2018 0343   CHOLHDL 3.2 01/19/2018 0343   VLDL 18 01/19/2018 0343   LDLCALC 119 (H) 01/19/2018 0343    Physical Exam:    VS:  BP 106/64   Pulse (!) 47   Ht 5' 8.5" (1.74 m)   Wt 146 lb (66.2 kg)   SpO2 97%   BMI 21.88 kg/m     Wt Readings from Last 3 Encounters:  02/27/18 146 lb (66.2 kg)  01/22/18 143 lb 1.6 oz (64.9 kg)  03/29/15 140 lb (63.5 kg)     Physical Exam  Constitutional: He is oriented to person, place, and time. He appears well-developed and well-nourished. No distress.  HENT:  Head:  Normocephalic and atraumatic.  Neck: Normal range of motion. Neck supple. No JVD present.  Cardiovascular: Regular rhythm, normal heart sounds and intact distal pulses. Bradycardia present. Exam reveals no gallop and no friction rub.  No murmur heard. Pulmonary/Chest: Effort normal and breath sounds normal. No respiratory distress. He has no wheezes. He has no rales.  Abdominal: Soft. Bowel sounds are normal.  Musculoskeletal: Normal range of motion.        General: No deformity or edema.  Neurological: He is alert and oriented to person, place, and time.  Skin: Skin is warm and dry.  Psychiatric: He has a normal mood and affect. His behavior is normal. Judgment and thought content normal.    ASSESSMENT:    1. Typical atrial flutter (HCC)   2. NSTEMI (non-ST elevated myocardial infarction) (HCC)   3. Mixed hyperlipidemia    PLAN:    In order of problems listed above:  New onset atrial flutter -Found on 01/18/2018.  Rate controlled not on any AV nodal blocking agents in the hospital. -He was started on Eliquis for stroke risk reduction -Currently he is in sinus rhythm with a rate of 44 bpm which pt states is normal for him. He is tolerating well.  -continue current therapy and will f/u in 6 months. He will need EKG to evaluate for atrial flutter since this sounds regular on exam.   S/P NSTEMI -Cardiac cath done on 01/21/2018 showed no significant CAD -She was started on low-dose aspirin, no Plavix due to need for anticoagulation. -Pt is having a lot of bruising. He is also very active and rides a bike regularly outside. I will message Dr. Mayford Knife about the need for continuing aspirin in the setting of anticoagulation.   Dyslipidemia -Started on atorvastatin 40 mg in the hospital -LDL was 119 on 01/19/2018.  Will recheck lipid panel and LFTs today. LDL goal <100.    Medication Adjustments/Labs and Tests Ordered: Current medicines are reviewed at length with the patient today.  Concerns  regarding medicines are outlined above. Labs and tests ordered and medication changes are outlined in the  patient instructions below:  Patient Instructions  Medication Instructions:  Your physician recommends that you continue on your current medications as directed. Please refer to the Current Medication list given to you today.  If you need a refill on your cardiac medications before your next appointment, please call your pharmacy.   Lab work: TODAY: LFT'S & LIPIDS  If you have labs (blood work) drawn today and your tests are completely normal, you will receive your results only by: Marland Kitchen. MyChart Message (if you have MyChart) OR . A paper copy in the mail If you have any lab test that is abnormal or we need to change your treatment, we will call you to review the results.  Testing/Procedures: None  Follow-Up: At [facility], you and your health needs are our priority.  As part of our continuing mission to provide you with exceptional heart care, we have created designated Provider Care Teams.  These Care Teams include your primary Cardiologist (physician) and Advanced Practice Providers (APPs -  Physician Assistants and Nurse Practitioners) who all work together to provide you with the care you need, when you need it. You will need a follow up appointment in 6 months.  Please call our office 2 months in advance to schedule this appointment.  You may see Richard Magicraci Turner, MD or one of the following Advanced Practice Providers on your designated Care Team:   HatfieldBrittainy Simmons, PA-C Ronie Spiesayna Dunn, PA-C . Jacolyn ReedyMichele Lenze, PA-C  Any Other Special Instructions Will Be Listed Below (If Applicable).       Signed, Berton BonJanine Oddis Westling, NP  02/27/2018 3:57 PM    [facility]

## 2018-02-27 NOTE — Patient Instructions (Signed)
Medication Instructions:  Your physician recommends that you continue on your current medications as directed. Please refer to the Current Medication list given to you today.  If you need a refill on your cardiac medications before your next appointment, please call your pharmacy.   Lab work: TODAY: LFT'S & LIPIDS  If you have labs (blood work) drawn today and your tests are completely normal, you will receive your results only by: Marland Kitchen MyChart Message (if you have MyChart) OR . A paper copy in the mail If you have any lab test that is abnormal or we need to change your treatment, we will call you to review the results.  Testing/Procedures: None  Follow-Up: At [facility], you and your health needs are our priority.  As part of our continuing mission to provide you with exceptional heart care, we have created designated Provider Care Teams.  These Care Teams include your primary Cardiologist (physician) and Advanced Practice Providers (APPs -  Physician Assistants and Nurse Practitioners) who all work together to provide you with the care you need, when you need it. You will need a follow up appointment in 6 months.  Please call our office 2 months in advance to schedule this appointment.  You may see Armanda Magic, MD or one of the following Advanced Practice Providers on your designated Care Team:   [place], PA-C Ronie Spies, PA-C . Jacolyn Reedy, PA-C  Any Other Special Instructions Will Be Listed Below (If Applicable).

## 2018-02-28 LAB — LIPID PANEL
CHOLESTEROL TOTAL: 143 mg/dL (ref 100–199)
Chol/HDL Ratio: 2.1 ratio (ref 0.0–5.0)
HDL: 69 mg/dL (ref >39)
LDL Calculated: 62 mg/dL (ref 0–99)
Triglycerides: 58 mg/dL (ref 0–149)
VLDL Cholesterol Cal: 12 mg/dL (ref 5–40)

## 2018-02-28 LAB — HEPATIC FUNCTION PANEL
ALT: 28 IU/L (ref 0–44)
AST: 29 IU/L (ref 0–40)
Albumin: 4.7 g/dL (ref 3.7–4.7)
Alkaline Phosphatase: 48 IU/L (ref 39–117)
Bilirubin Total: 0.3 mg/dL (ref 0.0–1.2)
Bilirubin, Direct: 0.13 mg/dL (ref 0.00–0.40)
Total Protein: 6.6 g/dL (ref 6.0–8.5)

## 2018-03-04 ENCOUNTER — Telehealth: Payer: Self-pay

## 2018-03-04 NOTE — Telephone Encounter (Signed)
-----   Message from Berton Bon, NP sent at 03/01/2018 11:45 AM EST ----- Can you call pt and tell him he can stop aspirin. Take off his med list.  Thx ----- Message ----- From: Quintella Reichert, MD Sent: 02/28/2018  11:38 AM EST To: Berton Bon, NP  There were no focal wall motion abnormalites on echo and no obvious CAD.  OK to stop ASA and continue DOAC due to increased bruising.  Traci ----- Message ----- From: Berton Bon, NP Sent: 02/27/2018   4:34 PM EST To: Quintella Reichert, MD  Dr. Mayford Knife, this pt had NSTEMI with trop 1.62 but normal coronaries on cath. Also had new aflutter, now in SR. He is on Eliquis and asp 81. He is active, outdoor bicycling and is bruising easily. Dr. Rennis Golden felt pt needed to be on aspirin. Pt is asking if he could stop asp. What do you think?

## 2018-03-04 NOTE — Telephone Encounter (Signed)
Pt is aware that he can stop his Aspirin. Pt verbalized understanding.

## 2018-08-20 ENCOUNTER — Other Ambulatory Visit: Payer: Self-pay | Admitting: Physician Assistant

## 2018-08-21 NOTE — Telephone Encounter (Signed)
24m 66.2kg Scr 1.22 01/22/18 lov hammond np 02/28/08

## 2018-09-04 ENCOUNTER — Ambulatory Visit: Payer: Medicare Other | Admitting: Cardiology

## 2018-10-11 ENCOUNTER — Telehealth: Payer: Self-pay | Admitting: *Deleted

## 2018-10-11 NOTE — Telephone Encounter (Signed)
Patient with diagnosis of aflutter on Eliquis for anticoagulation.    Procedure: UROLIFT  Date of procedure: TBD  CHADS2-VASc score of  1-2 ( AGE, ??CAD)  CrCl 50 ml/min  Per office protocol, patient can hold Eliquis for 2 days prior to procedure.

## 2018-10-11 NOTE — Telephone Encounter (Signed)
   Primary Cardiologist: Fransico Him, MD  Chart reviewed as part of pre-operative protocol coverage. Patient was contacted 10/11/2018 in reference to pre-operative risk assessment for pending surgery as outlined below.  Richard Perkins was last seen on 02/27/2018 by Daune Perch NP.  Since that day, Richard Perkins has done well without chest pain or dyspnea. He continue to exercise multiple times per week riding bicycles.   Therefore, based on ACC/AHA guidelines, the patient would be at acceptable risk for the planned procedure without further cardiovascular testing.   I will route this recommendation to the requesting party via Epic fax function and remove from pre-op pool.  Per our clinical pharmacist, he will need to hold eliquis for 2 days prior to the surgery and restart as soon as possible after the surgery at the surgeon's discretion  Please call with questions.  [place], [place] 10/11/2018, 1:15 PM

## 2018-10-11 NOTE — Telephone Encounter (Signed)
   [facility] Pre-operative Risk Assessment    Request for surgical clearance:  1. What type of surgery is being performed? UROLIFT    2. When is this surgery scheduled? TBD   3. What type of clearance is required (medical clearance vs. Pharmacy clearance to hold med vs. Both)? BOTH  4. Are there any medications that need to be held prior to surgery and how long? ELIQUIS X 2 DAYS PRIOR TO PROCEDURE    5. Practice name and name of physician performing surgery? [facility]; DR. Annie Main DAHLSTEDT   6. What is your office phone number [phone]    7.   What is your office fax number [phone]  8.   Anesthesia type (None, local, MAC, general) ? NITROUS   Julaine Hua 10/11/2018, 8:24 AM  _________________________________________________________________   (provider comments below)

## 2018-10-17 ENCOUNTER — Telehealth: Payer: Self-pay | Admitting: *Deleted

## 2018-10-17 NOTE — Telephone Encounter (Signed)
..   Virtual Visit Pre-Appointment Phone Call  "(Name), I am calling you today to discuss your upcoming appointment. We are currently trying to limit exposure to the virus that causes COVID-19 by seeing patients at home rather than in the office."  1. "What is the BEST phone number to call the day of the visit?" - include this in appointment notes  2. "Do you have or have access to (through a family member/friend) a smartphone with video capability that we can use for your visit?" a. If yes - list this number in appt notes as "cell" (if different from BEST phone #) and list the appointment type as a VIDEO visit in appointment notes b. If no - list the appointment type as a PHONE visit in appointment notes  3. Confirm consent - "In the setting of the current Covid19 crisis, you are scheduled for a (phone or video) visit with your provider on (date) at (time).  Just as we do with many in-office visits, in order for you to participate in this visit, we must obtain consent.  If you'd like, I can send this to your mychart (if signed up) or email for you to review.  Otherwise, I can obtain your verbal consent now.  All virtual visits are billed to your insurance company just like a normal visit would be.  By agreeing to a virtual visit, we'd like you to understand that the technology does not allow for your provider to perform an examination, and thus may limit your provider's ability to fully assess your condition. If your provider identifies any concerns that need to be evaluated in person, we will make arrangements to do so.  Finally, though the technology is pretty good, we cannot assure that it will always work on either your or our end, and in the setting of a video visit, we may have to convert it to a phone-only visit.  In either situation, we cannot ensure that we have a secure connection.  Are you willing to proceed?" STAFF: Did the patient verbally acknowledge consent to telehealth visit? Document  YES/NO here: YES  4. Advise patient to be prepared - "Two hours prior to your appointment, go ahead and check your blood pressure, pulse, oxygen saturation, and your weight (if you have the equipment to check those) and write them all down. When your visit starts, your provider will ask you for this information. If you have an Apple Watch or Kardia device, please plan to have heart rate information ready on the day of your appointment. Please have a pen and paper handy nearby the day of the visit as well."  5. Give patient instructions for MyChart download to smartphone OR Doximity/Doxy.me as below if video visit (depending on what platform provider is using)  6. Inform patient they will receive a phone call 15 minutes prior to their appointment time (may be from unknown caller ID) so they should be prepared to answer    TELEPHONE CALL NOTE  Richard Perkins has been deemed a candidate for a follow-up tele-health visit to limit community exposure during the Covid-19 pandemic. I spoke with the patient via phone to ensure availability of phone/video source, confirm preferred email & phone number, and discuss instructions and expectations.  I reminded Richard Perkins to be prepared with any vital sign and/or heart rhythm information that could potentially be obtained via home monitoring, at the time of his visit. I reminded Richard Perkins to expect a phone call prior  to his visit.  Valrie Hart, CMA 10/17/2018 11:22 AM   INSTRUCTIONS FOR DOWNLOADING THE MYCHART APP TO SMARTPHONE  - The patient must first make sure to have activated MyChart and know their login information - If Apple, go to [employer] and type in MyChart in the search bar and download the app. If Android, ask patient to go to [employer] and type in [place] in the search bar and download the app. The app is free but as with any other app downloads, their phone may require them to verify saved payment information or  Apple/Android password.  - The patient will need to then log into the app with their MyChart username and password, and select [place] as their healthcare provider to link the account. When it is time for your visit, go to the MyChart app, find appointments, and click Begin Video Visit. Be sure to Select Allow for your device to access the Microphone and Camera for your visit. You will then be connected, and your provider will be with you shortly.  **If they have any issues connecting, or need assistance please contact MyChart service desk [phone])**  **If using a computer, in order to ensure the best quality for their visit they will need to use either of the following Internet Browsers: [employer] or Google Chrome**  IF USING DOXIMITY or DOXY.ME - The patient will receive a link just prior to their visit by text.     FULL LENGTH CONSENT FOR TELE-HEALTH VISIT   I hereby voluntarily request, consent and authorize [facility] and its employed or contracted physicians, physician assistants, nurse practitioners or other licensed health care professionals (the Practitioner), to provide me with telemedicine health care services (the "Services") as deemed necessary by the treating Practitioner. I acknowledge and consent to receive the Services by the Practitioner via telemedicine. I understand that the telemedicine visit will involve communicating with the Practitioner through live audiovisual communication technology and the disclosure of certain medical information by electronic transmission. I acknowledge that I have been given the opportunity to request an in-person assessment or other available alternative prior to the telemedicine visit and am voluntarily participating in the telemedicine visit.  I understand that I have the right to withhold or withdraw my consent to the use of telemedicine in the course of my care at any time, without affecting my right to future care  or treatment, and that the Practitioner or I may terminate the telemedicine visit at any time. I understand that I have the right to inspect all information obtained and/or recorded in the course of the telemedicine visit and may receive copies of available information for a reasonable fee.  I understand that some of the potential risks of receiving the Services via telemedicine include:  Marland Kitchen Delay or interruption in medical evaluation due to technological equipment failure or disruption; . Information transmitted may not be sufficient (e.g. poor resolution of images) to allow for appropriate medical decision making by the Practitioner; and/or  . In rare instances, security protocols could fail, causing a breach of personal health information.  Furthermore, I acknowledge that it is my responsibility to provide information about my medical history, conditions and care that is complete and accurate to the best of my ability. I acknowledge that Practitioner's advice, recommendations, and/or decision may be based on factors not within their control, such as incomplete or inaccurate data provided by me or distortions of diagnostic images or specimens that may result from electronic  transmissions. I understand that the practice of medicine is not an exact science and that Practitioner makes no warranties or guarantees regarding treatment outcomes. I acknowledge that I will receive a copy of this consent concurrently upon execution via email to the email address I last provided but may also request a printed copy by calling the office of [place].    I understand that my insurance will be billed for this visit.   I have read or had this consent read to me. . I understand the contents of this consent, which adequately explains the benefits and risks of the Services being provided via telemedicine.  . I have been provided ample opportunity to ask questions regarding this consent and the Services and have had  my questions answered to my satisfaction. . I give my informed consent for the services to be provided through the use of telemedicine in my medical care  By participating in this telemedicine visit I agree to the above.

## 2018-10-18 ENCOUNTER — Encounter: Payer: Self-pay | Admitting: *Deleted

## 2018-10-18 ENCOUNTER — Encounter: Payer: Self-pay | Admitting: Cardiology

## 2018-10-18 ENCOUNTER — Telehealth (INDEPENDENT_AMBULATORY_CARE_PROVIDER_SITE_OTHER): Payer: Medicare Other | Admitting: Cardiology

## 2018-10-18 ENCOUNTER — Encounter

## 2018-10-18 ENCOUNTER — Other Ambulatory Visit: Payer: Self-pay

## 2018-10-18 VITALS — BP 108/58 | HR 61 | Ht 68.5 in | Wt 142.0 lb

## 2018-10-18 DIAGNOSIS — I214 Non-ST elevation (NSTEMI) myocardial infarction: Secondary | ICD-10-CM | POA: Diagnosis not present

## 2018-10-18 DIAGNOSIS — E7800 Pure hypercholesterolemia, unspecified: Secondary | ICD-10-CM

## 2018-10-18 DIAGNOSIS — I483 Typical atrial flutter: Secondary | ICD-10-CM

## 2018-10-18 NOTE — Patient Instructions (Signed)
Medication Instructions:   Your physician recommends that you continue on your current medications as directed. Please refer to the Current Medication list given to you today.  If you need a refill on your cardiac medications before your next appointment, please call your pharmacy.    Lab work:  ON NEXT Wednesday 10/23/18 AT 9:45 AM TO CHECK LIPIDS, ALT, BMET, AND CBC W DIFF-PLEASE COME FASTING TO THIS LAB APPOINTMENT  If you have labs (blood work) drawn today and your tests are completely normal, you will receive your results only by: Marland Kitchen MyChart Message (if you have MyChart) OR . A paper copy in the mail If you have any lab test that is abnormal or we need to change your treatment, we will call you to review the results.    Follow-Up: At [facility], you and your health needs are our priority.  As part of our continuing mission to provide you with exceptional heart care, we have created designated Provider Care Teams.  These Care Teams include your primary Cardiologist (physician) and Advanced Practice Providers (APPs -  Physician Assistants and Nurse Practitioners) who all work together to provide you with the care you need, when you need it.  Your physician wants you to follow-up in: 6 MONTHS WITH DR. Mallie Snooks will receive a reminder letter in the mail two months in advance. If you don't receive a letter, please call our office to schedule the follow-up appointment.

## 2018-10-18 NOTE — Progress Notes (Signed)
Virtual Visit via Video Note   This visit type was conducted due to national recommendations for restrictions regarding the COVID-19 Pandemic (e.g. social distancing) in an effort to limit this patient's exposure and mitigate transmission in our community.  Due to his co-morbid illnesses, this patient is at least at moderate risk for complications without adequate follow up.  This format is felt to be most appropriate for this patient at this time.  All issues noted in this document were discussed and addressed.  A limited physical exam was performed with this format.  Please refer to the patient's chart for his consent to telehealth for [facility].   Evaluation Performed:  Follow-up visit  This visit type was conducted due to national recommendations for restrictions regarding the COVID-19 Pandemic (e.g. social distancing).  This format is felt to be most appropriate for this patient at this time.  All issues noted in this document were discussed and addressed.  No physical exam was performed (except for noted visual exam findings with Video Visits).  Please refer to the patient's chart (MyChart message for video visits and phone note for telephone visits) for the patient's consent to telehealth for [facility].  Date:  10/18/2018   ID:  Richard Perkins, DOB August 01, 1945, MRN [id number]  Patient Location:  Home  Provider location:   [place]  PCP:  Lawerance Cruel, MD  Cardiologist:  Fransico Him, MD  Electrophysiologist:  None   Chief Complaint:  Atrial flutter and HLD  History of Present Illness:    Richard Perkins is a 73 y.o. male who presents via audio/video conferencing for a telehealth visit today.    This is a 73yo male with a history of NSTEMI in setting of new onset atrial flutter and NSTEMI.  Cath showed essentially normal coronary arteries and it was felt that he may have had an embolic from his atrial flutter vs. vasospsam v.s ruptured plaque with  recanalization.  He was started on Eliquis and has done well since then.  He is on statin for HLD.  He is here today for followup and is doing well.  He denies any chest pain or pressure, SOB, DOE, PND, orthopnea, LE edema, dizziness, palpitations or syncope. He is compliant with his meds and is tolerating meds with no SE.    The patient does not have symptoms concerning for COVID-19 infection (fever, chills, cough, or new shortness of breath).    Prior CV studies:   The following studies were reviewed today:  Cardiac cath and labs  Past Medical History:  Diagnosis Date  . Atrial flutter ([place])    a. dx 01/2018, rate controlled.  . CKD (chronic kidney disease), stage II   . Hyperlipidemia   . Hypotension    Appears baseline  . NSTEMI (non-ST elevated myocardial infarction) ([place])    a. 01/2018 - cath without significant CAD, unclear etiology - ? transient plaque rupture with recanalization vs spasm.  Marland Kitchen Urinary retention    Past Surgical History:  Procedure Laterality Date  . BASAL CELL CARCINOMA EXCISION    . COLONOSCOPY WITH PROPOFOL N/A 03/29/2015   Procedure: COLONOSCOPY WITH PROPOFOL;  Surgeon: Garlan Fair, MD;  Location: [facility];  Service: Endoscopy;  Laterality: N/A;  . KNEE ARTHROSCOPY W/ MENISCAL REPAIR    . LEFT HEART CATH AND CORONARY ANGIOGRAPHY N/A 01/21/2018   Procedure: LEFT HEART CATH AND CORONARY ANGIOGRAPHY;  Surgeon: Nelva Bush, MD;  Location: [facility];  Service: Cardiovascular;  Laterality: N/A;  . VARICOSE VEIN SURGERY       Current Meds  Medication Sig  . atorvastatin (LIPITOR) 40 MG tablet Take 1 tablet (40 mg total) by mouth every evening.  Marland Kitchen. ELIQUIS 5 MG TABS tablet TAKE 1 TABLET BY MOUTH TWICE A DAY  . tamsulosin (FLOMAX) 0.4 MG CAPS capsule Take 0.4 mg by mouth at bedtime.     Allergies:   Patient has no known allergies.   Social History   Tobacco Use  . Smoking status: Never Smoker  . Smokeless tobacco: Never Used  Substance  Use Topics  . Alcohol use: Yes    Comment: wine  nightly  . Drug use: Not on file     Family Hx: The patient's family history includes Heart failure in his mother.  ROS:   Please see the history of present illness.     All other systems reviewed and are negative.   Labs/Other Tests and Data Reviewed:    Recent Labs: 01/22/2018: BUN 13; Creatinine, Ser 1.22; Hemoglobin 11.8; Platelets 224; Potassium 4.3; Sodium 142; TSH 2.902 02/27/2018: ALT 28   Recent Lipid Panel Lab Results  Component Value Date/Time   CHOL 143 02/27/2018 04:00 PM   TRIG 58 02/27/2018 04:00 PM   HDL 69 02/27/2018 04:00 PM   CHOLHDL 2.1 02/27/2018 04:00 PM   CHOLHDL 3.2 01/19/2018 03:43 AM   LDLCALC 62 02/27/2018 04:00 PM    Wt Readings from Last 3 Encounters:  10/18/18 142 lb (64.4 kg)  02/27/18 146 lb (66.2 kg)  01/22/18 143 lb 1.6 oz (64.9 kg)     Objective:    Vital Signs:  BP (!) 108/58   Pulse 61   Ht 5' 8.5" (1.74 m)   Wt 142 lb (64.4 kg)   SpO2 99%   BMI 21.28 kg/m    CONSTITUTIONAL:  Well nourished, well developed male in no acute distress.  EYES: anicteric MOUTH: oral mucosa is pink RESPIRATORY: Normal respiratory effort, symmetric expansion CARDIOVASCULAR: No peripheral edema SKIN: No rash, lesions or ulcers MUSCULOSKELETAL: no digital cyanosis NEURO: Cranial Nerves II-XII grossly intact, moves all extremities PSYCH: Intact judgement and insight.  A&O x 3, Mood/affect appropriate   ASSESSMENT & PLAN:    1.  H/O NSTEMI -possibly cardioembolic from atrial flutter -cath showed no signficant CAD -no angina with activity including bike riding -no ASA due to DOAC  2.  Paroxysmal atrial flutter -he thinks he is in NSR -denies palpitations -given possibility of cardioembolic event resulting in NSTEMI I would prefer he stay on DOAC even with CHADS2VASC score of 1 which will be 2 in another year when he turns 74. -continue Eliquis 5mg  BID  -check BMEt and CBC  3.  HLD -LDL  goal < 70 -LDL was 62 02/2018 -repeat FLP and ALT -continue on atorvastatin 40mg  daily  COVID-19 Education: The signs and symptoms of COVID-19 were discussed with the patient and how to seek care for testing (follow up with PCP or arrange E-visit).  The importance of social distancing was discussed today.  Patient Risk:   After full review of this patient's clinical status, I feel that they are at least moderate risk at this time.  Time:   Today, I have spent 20 minutes directly with the patient on telemedicine discussing medical problems including PAflutter, HLD.  We also reviewed the symptoms of COVID 19 and the ways to protect against contracting the virus with telehealth technology.  I spent an additional 5 minutes reviewing patient's  chart including labs and cath.  Medication Adjustments/Labs and Tests Ordered: Current medicines are reviewed at length with the patient today.  Concerns regarding medicines are outlined above.  Tests Ordered: No orders of the defined types were placed in this encounter.  Medication Changes: No orders of the defined types were placed in this encounter.   Disposition:  Follow up in 6 month(s) with PA  Signed, Armanda Magic, MD  10/18/2018 2:32 PM    [facility]

## 2018-10-23 ENCOUNTER — Other Ambulatory Visit: Payer: Medicare Other | Admitting: *Deleted

## 2018-10-23 ENCOUNTER — Other Ambulatory Visit: Payer: Self-pay

## 2018-10-23 DIAGNOSIS — I214 Non-ST elevation (NSTEMI) myocardial infarction: Secondary | ICD-10-CM

## 2018-10-23 DIAGNOSIS — I483 Typical atrial flutter: Secondary | ICD-10-CM

## 2018-10-23 DIAGNOSIS — E7800 Pure hypercholesterolemia, unspecified: Secondary | ICD-10-CM

## 2018-10-23 LAB — BASIC METABOLIC PANEL
BUN/Creatinine Ratio: 11 (ref 10–24)
BUN: 13 mg/dL (ref 8–27)
CO2: 26 mmol/L (ref 20–29)
Calcium: 9.3 mg/dL (ref 8.6–10.2)
Chloride: 104 mmol/L (ref 96–106)
Creatinine, Ser: 1.14 mg/dL (ref 0.76–1.27)
GFR calc Af Amer: 73 mL/min/{1.73_m2} (ref >59)
GFR calc non Af Amer: 63 mL/min/{1.73_m2} (ref >59)
Glucose: 88 mg/dL (ref 65–99)
Potassium: 4.2 mmol/L (ref 3.5–5.2)
Sodium: 142 mmol/L (ref 134–144)

## 2018-10-23 LAB — LIPID PANEL
Chol/HDL Ratio: 2.2 ratio (ref 0.0–5.0)
Cholesterol, Total: 155 mg/dL (ref 100–199)
HDL: 69 mg/dL (ref >39)
LDL Chol Calc (NIH): 72 mg/dL (ref 0–99)
Triglycerides: 71 mg/dL (ref 0–149)
VLDL Cholesterol Cal: 14 mg/dL (ref 5–40)

## 2018-10-23 LAB — CBC WITH DIFFERENTIAL/PLATELET
Basophils Absolute: 0 10*3/uL (ref 0.0–0.2)
Basos: 1 %
EOS (ABSOLUTE): 0.1 10*3/uL (ref 0.0–0.4)
Eos: 2 %
Hematocrit: 39.9 % (ref 37.5–51.0)
Hemoglobin: 13.3 g/dL (ref 13.0–17.7)
Immature Grans (Abs): 0 10*3/uL (ref 0.0–0.1)
Immature Granulocytes: 0 %
Lymphocytes Absolute: 1.5 10*3/uL (ref 0.7–3.1)
Lymphs: 30 %
MCH: 32.9 pg (ref 26.6–33.0)
MCHC: 33.3 g/dL (ref 31.5–35.7)
MCV: 99 fL — ABNORMAL HIGH (ref 79–97)
Monocytes Absolute: 0.5 10*3/uL (ref 0.1–0.9)
Monocytes: 9 %
Neutrophils Absolute: 3.1 10*3/uL (ref 1.4–7.0)
Neutrophils: 58 %
Platelets: 235 10*3/uL (ref 150–450)
RBC: 4.04 x10E6/uL — ABNORMAL LOW (ref 4.14–5.80)
RDW: 11.8 % (ref 11.6–15.4)
WBC: 5.2 10*3/uL (ref 3.4–10.8)

## 2018-10-23 LAB — ALT: ALT: 45 IU/L — ABNORMAL HIGH (ref 0–44)

## 2018-12-31 ENCOUNTER — Other Ambulatory Visit: Payer: Self-pay | Admitting: Cardiology

## 2018-12-31 NOTE — Telephone Encounter (Signed)
85m 66.2kg Scr 1.14 Lovw/turner 10/18/18

## 2019-02-07 ENCOUNTER — Ambulatory Visit: Payer: Medicare PPO | Attending: Internal Medicine

## 2019-02-07 DIAGNOSIS — Z23 Encounter for immunization: Secondary | ICD-10-CM | POA: Insufficient documentation

## 2019-02-07 NOTE — Progress Notes (Signed)
   Covid-19 Vaccination Clinic  Name:  Richard Perkins    MRN: [id number] DOB: 04-23-1945  02/07/2019  [name] was observed post Covid-19 immunization for 15 minutes without incidence. He was provided with Vaccine Information Sheet and instruction to access the V-Safe system.   [name] was instructed to call 911 with any severe reactions post vaccine: Marland Kitchen Difficulty breathing  . Swelling of your face and throat  . A fast heartbeat  . A bad rash all over your body  . Dizziness and weakness    Immunizations Administered    Name Date Dose VIS Date Route   Pfizer COVID-19 Vaccine 02/07/2019  1:52 PM 0.3 mL 12/27/2018 Intramuscular   Manufacturer: [employer], [employer]   Lot: [id number]   NDC: [address]-4

## 2019-02-26 ENCOUNTER — Ambulatory Visit: Payer: Medicare PPO | Attending: Internal Medicine

## 2019-02-26 DIAGNOSIS — Z23 Encounter for immunization: Secondary | ICD-10-CM | POA: Insufficient documentation

## 2019-02-26 NOTE — Progress Notes (Signed)
   Covid-19 Vaccination Clinic  Name:  Richard Perkins    MRN: [id number] DOB: 1945/07/03  02/26/2019  [name] was observed post Covid-19 immunization for 15 minutes without incidence. He was provided with Vaccine Information Sheet and instruction to access the V-Safe system.   [name] was instructed to call 911 with any severe reactions post vaccine: Marland Kitchen Difficulty breathing  . Swelling of your face and throat  . A fast heartbeat  . A bad rash all over your body  . Dizziness and weakness    Immunizations Administered    Name Date Dose VIS Date Route   Pfizer COVID-19 Vaccine 02/26/2019  9:57 AM 0.3 mL 12/27/2018 Intramuscular   Manufacturer: [employer], [employer]   Lot: [id number]   NDC: [address]-7

## 2019-05-06 ENCOUNTER — Encounter: Payer: Self-pay | Admitting: Cardiology

## 2019-05-06 ENCOUNTER — Other Ambulatory Visit: Payer: Self-pay

## 2019-05-06 ENCOUNTER — Ambulatory Visit: Payer: Medicare PPO | Admitting: Cardiology

## 2019-05-06 VITALS — BP 106/62 | HR 58 | Ht 68.5 in | Wt 145.8 lb

## 2019-05-06 DIAGNOSIS — I214 Non-ST elevation (NSTEMI) myocardial infarction: Secondary | ICD-10-CM

## 2019-05-06 DIAGNOSIS — I483 Typical atrial flutter: Secondary | ICD-10-CM

## 2019-05-06 DIAGNOSIS — E7800 Pure hypercholesterolemia, unspecified: Secondary | ICD-10-CM

## 2019-05-06 NOTE — Patient Instructions (Signed)

## 2019-05-06 NOTE — Progress Notes (Signed)
Cardiology Office Note:    Date:  05/06/2019   ID:  Richard Perkins, DOB 05-16-1945, MRN [id number]  PCP:  Lawerance Cruel, MD  Cardiologist:  Fransico Him, MD    Referring MD: Lawerance Cruel, MD   Chief Complaint  Patient presents with  . Coronary Artery Disease  . Atrial Flutter  . Hyperlipidemia    History of Present Illness:    Richard Perkins is a 74 y.o. male with a hx of NSTEMI in setting of new onset atrial flutter and NSTEMI.  Cath showed essentially normal coronary arteries and it was felt that he may have had an embolic from his atrial flutter vs. vasospsam v.s ruptured plaque with recanalization.  He was started on Eliquis and has done well since then.  He is on statin for HLD.  He is here today for followup and is doing well.  He denies any chest pain or pressure, SOB, DOE, PND, orthopnea, LE edema, dizziness, palpitations or syncope. He is compliant with his meds and is tolerating meds with no SE.    Past Medical History:  Diagnosis Date  . Atrial flutter ([place])    a. dx 01/2018, rate controlled.  . CKD (chronic kidney disease), stage II   . Hyperlipidemia   . Hypotension    Appears baseline  . NSTEMI (non-ST elevated myocardial infarction) ([place])    a. 01/2018 - cath without significant CAD, unclear etiology - ? transient plaque rupture with recanalization vs spasm.  Marland Kitchen Urinary retention     Past Surgical History:  Procedure Laterality Date  . BASAL CELL CARCINOMA EXCISION    . COLONOSCOPY WITH PROPOFOL N/A 03/29/2015   Procedure: COLONOSCOPY WITH PROPOFOL;  Surgeon: Garlan Fair, MD;  Location: [facility];  Service: Endoscopy;  Laterality: N/A;  . KNEE ARTHROSCOPY W/ MENISCAL REPAIR    . LEFT HEART CATH AND CORONARY ANGIOGRAPHY N/A 01/21/2018   Procedure: LEFT HEART CATH AND CORONARY ANGIOGRAPHY;  Surgeon: Nelva Bush, MD;  Location: [facility];  Service: Cardiovascular;  Laterality: N/A;  . VARICOSE VEIN SURGERY      Current  Medications: Current Meds  Medication Sig  . atorvastatin (LIPITOR) 40 MG tablet Take 1 tablet (40 mg total) by mouth every evening.  Marland Kitchen BESIVANCE 0.6 % SUSP   . ELIQUIS 5 MG TABS tablet TAKE 1 TABLET BY MOUTH TWICE A DAY  . ergocalciferol (VITAMIN D2) 1.25 MG (50000 UT) capsule Take 50,000 Units by mouth once a week.  . LOTEMAX 0.5 % ophthalmic suspension 1 drop 4 (four) times daily.  Marland Kitchen PROLENSA 0.07 % SOLN Place 1 drop into the right eye daily.  . tamsulosin (FLOMAX) 0.4 MG CAPS capsule Take 0.4 mg by mouth at bedtime.     Allergies:   Patient has no known allergies.   Social History   Socioeconomic History  . Marital status: Married    Spouse name: Not on file  . Number of children: Not on file  . Years of education: Not on file  . Highest education level: Not on file  Occupational History  . Not on file  Tobacco Use  . Smoking status: Never Smoker  . Smokeless tobacco: Never Used  Substance and Sexual Activity  . Alcohol use: Yes    Comment: wine  nightly  . Drug use: Not on file  . Sexual activity: Not on file  Other Topics Concern  . Not on file  Social History Narrative  . Not on file  Social Determinants of Health   Financial Resource Strain:   . Difficulty of Paying Living Expenses:   Food Insecurity:   . Worried About Programme researcher, broadcasting/film/video in the Last Year:   . Barista in the Last Year:   Transportation Needs:   . Freight forwarder (Medical):   Marland Kitchen Lack of Transportation (Non-Medical):   Physical Activity:   . Days of Exercise per Week:   . Minutes of Exercise per Session:   Stress:   . Feeling of Stress :   Social Connections:   . Frequency of Communication with Friends and Family:   . Frequency of Social Gatherings with Friends and Family:   . Attends Religious Services:   . Active Member of Clubs or Organizations:   . Attends Banker Meetings:   Marland Kitchen Marital Status:      Family History: The patient's family history includes  Heart failure in his mother.  ROS:   Please see the history of present illness.    ROS  All other systems reviewed and negative.   EKGs/Labs/Other Studies Reviewed:    The following studies were reviewed today: none  EKG:  EKG is  ordered today.  The ekg ordered today demonstrates Sinus bradycardia at 58bpm with no ST changes  Recent Labs: 10/23/2018: ALT 45; BUN 13; Creatinine, Ser 1.14; Hemoglobin 13.3; Platelets 235; Potassium 4.2; Sodium 142   Recent Lipid Panel    Component Value Date/Time   CHOL 155 10/23/2018 0805   TRIG 71 10/23/2018 0805   HDL 69 10/23/2018 0805   CHOLHDL 2.2 10/23/2018 0805   CHOLHDL 3.2 01/19/2018 0343   VLDL 18 01/19/2018 0343   LDLCALC 72 10/23/2018 0805    Physical Exam:    VS:  BP 106/62   Pulse (!) 58   Ht 5' 8.5" (1.74 m)   Wt 145 lb 12.8 oz (66.1 kg)   BMI 21.85 kg/m     Wt Readings from Last 3 Encounters:  05/06/19 145 lb 12.8 oz (66.1 kg)  10/18/18 142 lb (64.4 kg)  02/27/18 146 lb (66.2 kg)     GEN:  Well nourished, well developed in no acute distress HEENT: Normal NECK: No JVD; No carotid bruits LYMPHATICS: No lymphadenopathy CARDIAC: RRR, no murmurs, rubs, gallops RESPIRATORY:  Clear to auscultation without rales, wheezing or rhonchi  ABDOMEN: Soft, non-tender, non-distended MUSCULOSKELETAL:  No edema; No deformity  SKIN: Warm and dry NEUROLOGIC:  Alert and oriented x 3 PSYCHIATRIC:  Normal affect   ASSESSMENT:    1. NSTEMI (non-ST elevated myocardial infarction) (H[facility])   2. Typical atrial flutter (HCC)   3. Pure hypercholesterolemia    PLAN:    In order of problems listed above:  1.  H/O NSTEMI -possibly cardioembolic from atrial flutter -cath showed no signficant CAD -he denies any anginal symtptoms -no ASA due to DOAC  2.  Paroxysmal atrial flutter -he is maintaining NSR on exam today -he has not had any problems with palpitations -given possibility of cardioembolic event resulting in NSTEMI he will  remain on DOAC even with CHADS2VASC score of 1 which will be 2 in another year when he turns 74. -continue Eliquis 5mg  BID  -outside labs reviewed from PCP and showed Creatinine normal at 1.1 and Hbg 14.5 in Feb 2021  3.  HLD -LDL goal < 70 -LDL was 72 in Oct 2020 -continue atorvastatin 40mg  daily   Medication Adjustments/Labs and Tests Ordered: Current medicines are reviewed at length with  the patient today.  Concerns regarding medicines are outlined above.  Orders Placed This Encounter  Procedures  . EKG 12-Lead   No orders of the defined types were placed in this encounter.   Signed, Armanda Magic, MD  05/06/2019 10:48 AM    [facility]

## 2019-05-22 DIAGNOSIS — L821 Other seborrheic keratosis: Secondary | ICD-10-CM | POA: Diagnosis not present

## 2019-05-22 DIAGNOSIS — L570 Actinic keratosis: Secondary | ICD-10-CM | POA: Diagnosis not present

## 2019-05-22 DIAGNOSIS — L858 Other specified epidermal thickening: Secondary | ICD-10-CM | POA: Diagnosis not present

## 2019-05-22 DIAGNOSIS — Z85828 Personal history of other malignant neoplasm of skin: Secondary | ICD-10-CM | POA: Diagnosis not present

## 2019-06-06 ENCOUNTER — Other Ambulatory Visit: Payer: Self-pay | Admitting: Cardiology

## 2019-06-06 NOTE — Telephone Encounter (Signed)
Prescription refill request for Eliquis received.  Last office visit: 05/06/2019, Turner Scr: 1.14, 10/23/2018 Age: 74 y.o. Weight: 66.1 kg   Prescription refill sent.

## 2019-08-13 DIAGNOSIS — R351 Nocturia: Secondary | ICD-10-CM | POA: Diagnosis not present

## 2019-08-13 DIAGNOSIS — N401 Enlarged prostate with lower urinary tract symptoms: Secondary | ICD-10-CM | POA: Diagnosis not present

## 2019-08-13 DIAGNOSIS — R972 Elevated prostate specific antigen [PSA]: Secondary | ICD-10-CM | POA: Diagnosis not present

## 2019-12-16 ENCOUNTER — Other Ambulatory Visit: Payer: Self-pay | Admitting: Cardiology

## 2019-12-16 NOTE — Telephone Encounter (Signed)
Pt last saw Dr Mayford Knife 05/06/19, last labs 03/05/19 Creat 1.1 at [facility] per [facility], age 74, weight 66.1kg, based on specified criteria pt is on appropriate dosage of Eliquis 5mg  BID.  Will refill rx.

## 2020-01-18 DIAGNOSIS — Z20822 Contact with and (suspected) exposure to covid-19: Secondary | ICD-10-CM | POA: Diagnosis not present

## 2020-01-18 DIAGNOSIS — Z03818 Encounter for observation for suspected exposure to other biological agents ruled out: Secondary | ICD-10-CM | POA: Diagnosis not present

## 2020-02-08 DIAGNOSIS — Z20822 Contact with and (suspected) exposure to covid-19: Secondary | ICD-10-CM | POA: Diagnosis not present

## 2020-03-07 DIAGNOSIS — Z20822 Contact with and (suspected) exposure to covid-19: Secondary | ICD-10-CM | POA: Diagnosis not present

## 2020-03-24 DIAGNOSIS — Z79899 Other long term (current) drug therapy: Secondary | ICD-10-CM | POA: Diagnosis not present

## 2020-03-24 DIAGNOSIS — E7800 Pure hypercholesterolemia, unspecified: Secondary | ICD-10-CM | POA: Diagnosis not present

## 2020-03-24 DIAGNOSIS — Z862 Personal history of diseases of the blood and blood-forming organs and certain disorders involving the immune mechanism: Secondary | ICD-10-CM | POA: Diagnosis not present

## 2020-03-24 DIAGNOSIS — E559 Vitamin D deficiency, unspecified: Secondary | ICD-10-CM | POA: Diagnosis not present

## 2020-03-24 DIAGNOSIS — Z0000 Encounter for general adult medical examination without abnormal findings: Secondary | ICD-10-CM | POA: Diagnosis not present

## 2020-05-04 ENCOUNTER — Ambulatory Visit: Payer: Medicare PPO | Admitting: Cardiology

## 2020-05-29 DIAGNOSIS — Z20822 Contact with and (suspected) exposure to covid-19: Secondary | ICD-10-CM | POA: Diagnosis not present

## 2020-05-31 ENCOUNTER — Other Ambulatory Visit: Payer: Self-pay

## 2020-05-31 ENCOUNTER — Ambulatory Visit: Payer: Medicare PPO | Admitting: Cardiology

## 2020-05-31 ENCOUNTER — Encounter: Payer: Self-pay | Admitting: Cardiology

## 2020-05-31 VITALS — BP 104/60 | HR 57 | Ht 68.5 in | Wt 138.8 lb

## 2020-05-31 DIAGNOSIS — I214 Non-ST elevation (NSTEMI) myocardial infarction: Secondary | ICD-10-CM | POA: Diagnosis not present

## 2020-05-31 DIAGNOSIS — E7800 Pure hypercholesterolemia, unspecified: Secondary | ICD-10-CM

## 2020-05-31 DIAGNOSIS — R252 Cramp and spasm: Secondary | ICD-10-CM

## 2020-05-31 DIAGNOSIS — I483 Typical atrial flutter: Secondary | ICD-10-CM | POA: Diagnosis not present

## 2020-05-31 MED ORDER — ELIQUIS 5 MG PO TABS: 1.0000 | ORAL_TABLET | Freq: Two times a day (BID) | ORAL | 5 refills | Status: AC

## 2020-05-31 NOTE — Patient Instructions (Signed)
Medication Instructions:  Your physician recommends that you continue on your current medications as directed. Please refer to the Current Medication list given to you today.  *If you need a refill on your cardiac medications before your next appointment, please call your pharmacy*   Lab Work: TODAY: BMET and magnesium  If you have labs (blood work) drawn today and your tests are completely normal, you will receive your results only by: . MyChart Message (if you have MyChart) OR . A paper copy in the mail If you have any lab test that is abnormal or we need to change your treatment, we will call you to review the results.  Follow-Up: At [facility], you and your health needs are our priority.  As part of our continuing mission to provide you with exceptional heart care, we have created designated Provider Care Teams.  These Care Teams include your primary Cardiologist (physician) and Advanced Practice Providers (APPs -  Physician Assistants and Nurse Practitioners) who all work together to provide you with the care you need, when you need it.  Your next appointment:   1 year(s)  The format for your next appointment:   In Person  Provider:   You may see Traci Turner, MD or one of the following Advanced Practice Providers on your designated Care Team:    Dayna Dunn, PA-C  Michele Lenze, PA-C    

## 2020-05-31 NOTE — Progress Notes (Signed)
Cardiology Office Note:    Date:  05/31/2020   ID:  Richard Perkins, DOB May 05, 1945, MRN [id number]  PCP:  Daisy Floro, MD  Cardiologist:  Armanda Magic, MD    Referring MD: Daisy Floro, MD   Chief Complaint  Patient presents with  . Atrial Flutter  . Hyperlipidemia    History of Present Illness:    Richard Perkins is a 75 y.o. male with a hx of NSTEMI in setting of new onset atrial flutter and NSTEMI.  Cath showed essentially normal coronary arteries and it was felt that he may have had an embolic from his atrial flutter vs. vasospsam v.s ruptured plaque with recanalization.  He was started on Eliquis and has done well since then.  He is on statin for HLD.  He is here today for followup of his atrial flutter and HLD and is doing well.  He denies any chest pain or pressure, SOB, DOE, PND, orthopnea, dizziness, palpitations or syncope. He has noticed some LE edema recently.  He admits to salting his food.  He lost his wife last November to CHF and pulmonary fibrosis.  He is compliant with his meds and is tolerating meds with no SE.    Past Medical History:  Diagnosis Date  . Atrial flutter ([facility])    a. dx 01/2018, rate controlled.  . CKD (chronic kidney disease), stage II   . Hyperlipidemia   . Hypotension    Appears baseline  . NSTEMI (non-ST elevated myocardial infarction) ([facility])    a. 01/2018 - cath without significant CAD, unclear etiology - ? transient plaque rupture with recanalization vs spasm.  Marland Kitchen Urinary retention     Past Surgical History:  Procedure Laterality Date  . BASAL CELL CARCINOMA EXCISION    . COLONOSCOPY WITH PROPOFOL N/A 03/29/2015   Procedure: COLONOSCOPY WITH PROPOFOL;  Surgeon: Charolett Bumpers, MD;  Location: [facility];  Service: Endoscopy;  Laterality: N/A;  . KNEE ARTHROSCOPY W/ MENISCAL REPAIR    . LEFT HEART CATH AND CORONARY ANGIOGRAPHY N/A 01/21/2018   Procedure: LEFT HEART CATH AND CORONARY ANGIOGRAPHY;  Surgeon: Yvonne Kendall,  MD;  Location: [facility];  Service: Cardiovascular;  Laterality: N/A;  . VARICOSE VEIN SURGERY      Current Medications: Current Meds  Medication Sig  . atorvastatin (LIPITOR) 40 MG tablet Take 1 tablet (40 mg total) by mouth every evening.  Marland Kitchen ELIQUIS 5 MG TABS tablet TAKE 1 TABLET BY MOUTH TWICE A DAY  . ergocalciferol (VITAMIN D2) 1.25 MG (50000 UT) capsule Take 50,000 Units by mouth once a week.  . Magnesium 500 MG CAPS Take 500 mg by mouth as directed. LEG CRAMPS AT NIGHT  . tamsulosin (FLOMAX) 0.4 MG CAPS capsule Take 0.4 mg by mouth at bedtime.     Allergies:   Patient has no known allergies.   Social History   Socioeconomic History  . Marital status: Married    Spouse name: Not on file  . Number of children: Not on file  . Years of education: Not on file  . Highest education level: Not on file  Occupational History  . Not on file  Tobacco Use  . Smoking status: Never Smoker  . Smokeless tobacco: Never Used  Substance and Sexual Activity  . Alcohol use: Yes    Comment: wine  nightly  . Drug use: Not on file  . Sexual activity: Not on file  Other Topics Concern  . Not on file  Social History Narrative  . Not on file   Social Determinants of Health   Financial Resource Strain: Not on file  Food Insecurity: Not on file  Transportation Needs: Not on file  Physical Activity: Not on file  Stress: Not on file  Social Connections: Not on file     Family History: The patient's family history includes Heart failure in his mother.  ROS:   Please see the history of present illness.    ROS  All other systems reviewed and negative.   EKGs/Labs/Other Studies Reviewed:    The following studies were reviewed today: none  EKG:  EKG is ordered today.  The ekg ordered today demonstrates sinus bradycardia at 57bpm with no ST changes Recent Labs: No results found for requested labs within last 8760 hours.   Recent Lipid Panel    Component Value Date/Time    CHOL 155 10/23/2018 0805   TRIG 71 10/23/2018 0805   HDL 69 10/23/2018 0805   CHOLHDL 2.2 10/23/2018 0805   CHOLHDL 3.2 01/19/2018 0343   VLDL 18 01/19/2018 0343   LDLCALC 72 10/23/2018 0805    Physical Exam:    VS:  BP 104/60   Pulse (!) 57   Ht 5' 8.5" (1.74 m)   Wt 138 lb 12.8 oz (63 kg)   BMI 20.80 kg/m     Wt Readings from Last 3 Encounters:  05/31/20 138 lb 12.8 oz (63 kg)  05/06/19 145 lb 12.8 oz (66.1 kg)  10/18/18 142 lb (64.4 kg)    GEN: Well nourished, well developed in no acute distress HEENT: Normal NECK: No JVD; No carotid bruits LYMPHATICS: No lymphadenopathy CARDIAC:RRR, no murmurs, rubs, gallops RESPIRATORY:  Clear to auscultation without rales, wheezing or rhonchi  ABDOMEN: Soft, non-tender, non-distended MUSCULOSKELETAL:  Trace bilateral ankle edema; No deformity  SKIN: Warm and dry NEUROLOGIC:  Alert and oriented x 3 PSYCHIATRIC:  Normal affect    ASSESSMENT:    1. NSTEMI (non-ST elevated myocardial infarction) (HCC)   2. Typical atrial flutter (HCC)   3. Pure hypercholesterolemia   4. Leg cramps    PLAN:    In order of problems listed above:  1.  H/O NSTEMI -possibly cardioembolic from atrial flutter -cath showed no signficant CAD -he has not had any anginal symptoms since I saw him last -no ASA due to DOAC  2.  Paroxysmal atrial flutter -he continues to maintain NSR and has not had any palpitations -he has not had any bleeding problems on DOAC except for bruising on his arms if he bangs into something -CHADS2VASC score of 2  -continue prescription drug management with Eliquis 5mg  BID with PRN refills  -I have reviewed the outside labs from his PCP from 03/24/2020 which showed stable SCr of 1 and Hbg 15  3.  HLD -LDL goal < 70 -LDL was 67 on my review of outside labs from PCP on 03/24/2020 -continue medical management with  atorvastatin 40mg  daily  4.  Leg cramp -these occur at night -he takes PRN Magnesium -I will check a BMET  and Mag level   Medication Adjustments/Labs and Tests Ordered: Current medicines are reviewed at length with the patient today.  Concerns regarding medicines are outlined above.  Orders Placed This Encounter  Procedures  . EKG 12-Lead   No orders of the defined types were placed in this encounter.   Signed, 05/24/2020, MD  05/31/2020 2:44 PM    [facility]

## 2020-05-31 NOTE — Addendum Note (Signed)
Addended by: Theresia Majors on: 05/31/2020 02:50 PM   Modules accepted: Orders

## 2020-06-01 LAB — BASIC METABOLIC PANEL
BUN/Creatinine Ratio: 13 (ref 10–24)
BUN: 14 mg/dL (ref 8–27)
CO2: 25 mmol/L (ref 20–29)
Calcium: 9.2 mg/dL (ref 8.6–10.2)
Chloride: 101 mmol/L (ref 96–106)
Creatinine, Ser: 1.11 mg/dL (ref 0.76–1.27)
Glucose: 91 mg/dL (ref 65–99)
Potassium: 4.3 mmol/L (ref 3.5–5.2)
Sodium: 142 mmol/L (ref 134–144)
eGFR: 69 mL/min/{1.73_m2} (ref >59)

## 2020-06-01 LAB — MAGNESIUM: Magnesium: 2.4 mg/dL — ABNORMAL HIGH (ref 1.6–2.3)

## 2020-06-07 DIAGNOSIS — Z85828 Personal history of other malignant neoplasm of skin: Secondary | ICD-10-CM | POA: Diagnosis not present

## 2020-06-07 DIAGNOSIS — D1801 Hemangioma of skin and subcutaneous tissue: Secondary | ICD-10-CM | POA: Diagnosis not present

## 2020-06-07 DIAGNOSIS — L821 Other seborrheic keratosis: Secondary | ICD-10-CM | POA: Diagnosis not present

## 2020-06-07 DIAGNOSIS — L111 Transient acantholytic dermatosis [Grover]: Secondary | ICD-10-CM | POA: Diagnosis not present

## 2020-08-09 DIAGNOSIS — Z961 Presence of intraocular lens: Secondary | ICD-10-CM | POA: Diagnosis not present

## 2020-10-07 DIAGNOSIS — M25512 Pain in left shoulder: Secondary | ICD-10-CM | POA: Diagnosis not present

## 2020-10-07 DIAGNOSIS — Z23 Encounter for immunization: Secondary | ICD-10-CM | POA: Diagnosis not present

## 2020-10-21 ENCOUNTER — Ambulatory Visit
Admission: RE | Admit: 2020-10-21 | Discharge: 2020-10-21 | Disposition: A | Discharge status: Home / Self Care | Payer: Medicare PPO | Source: Ambulatory Visit | Attending: Sports Medicine | Admitting: Sports Medicine

## 2020-10-21 ENCOUNTER — Other Ambulatory Visit: Payer: Self-pay | Admitting: Sports Medicine

## 2020-10-21 DIAGNOSIS — M25512 Pain in left shoulder: Secondary | ICD-10-CM

## 2020-12-08 DIAGNOSIS — M25512 Pain in left shoulder: Secondary | ICD-10-CM | POA: Diagnosis not present

## 2020-12-16 DIAGNOSIS — M25512 Pain in left shoulder: Secondary | ICD-10-CM | POA: Diagnosis not present

## 2020-12-29 DIAGNOSIS — M25512 Pain in left shoulder: Secondary | ICD-10-CM | POA: Diagnosis not present

## 2021-01-04 DIAGNOSIS — M25512 Pain in left shoulder: Secondary | ICD-10-CM | POA: Diagnosis not present

## 2021-01-05 DIAGNOSIS — M25512 Pain in left shoulder: Secondary | ICD-10-CM | POA: Diagnosis not present

## 2021-01-18 DIAGNOSIS — M25512 Pain in left shoulder: Secondary | ICD-10-CM | POA: Diagnosis not present

## 2021-01-19 DIAGNOSIS — Z85828 Personal history of other malignant neoplasm of skin: Secondary | ICD-10-CM | POA: Diagnosis not present

## 2021-01-19 DIAGNOSIS — Z823 Family history of stroke: Secondary | ICD-10-CM | POA: Diagnosis not present

## 2021-01-19 DIAGNOSIS — N400 Enlarged prostate without lower urinary tract symptoms: Secondary | ICD-10-CM | POA: Diagnosis not present

## 2021-01-19 DIAGNOSIS — E785 Hyperlipidemia, unspecified: Secondary | ICD-10-CM | POA: Diagnosis not present

## 2021-01-19 DIAGNOSIS — K219 Gastro-esophageal reflux disease without esophagitis: Secondary | ICD-10-CM | POA: Diagnosis not present

## 2021-01-19 DIAGNOSIS — Z8249 Family history of ischemic heart disease and other diseases of the circulatory system: Secondary | ICD-10-CM | POA: Diagnosis not present

## 2021-01-19 DIAGNOSIS — Z7901 Long term (current) use of anticoagulants: Secondary | ICD-10-CM | POA: Diagnosis not present

## 2021-01-19 DIAGNOSIS — I4892 Unspecified atrial flutter: Secondary | ICD-10-CM | POA: Diagnosis not present

## 2021-01-19 DIAGNOSIS — Z809 Family history of malignant neoplasm, unspecified: Secondary | ICD-10-CM | POA: Diagnosis not present

## 2021-01-26 DIAGNOSIS — M25512 Pain in left shoulder: Secondary | ICD-10-CM | POA: Diagnosis not present

## 2021-02-01 DIAGNOSIS — M25512 Pain in left shoulder: Secondary | ICD-10-CM | POA: Diagnosis not present

## 2021-02-22 DIAGNOSIS — M25512 Pain in left shoulder: Secondary | ICD-10-CM | POA: Diagnosis not present

## 2021-03-02 DIAGNOSIS — M25512 Pain in left shoulder: Secondary | ICD-10-CM | POA: Diagnosis not present

## 2021-03-06 DIAGNOSIS — S61411A Laceration without foreign body of right hand, initial encounter: Secondary | ICD-10-CM | POA: Diagnosis not present

## 2021-03-14 DIAGNOSIS — M25512 Pain in left shoulder: Secondary | ICD-10-CM | POA: Diagnosis not present

## 2021-03-15 DIAGNOSIS — S61401A Unspecified open wound of right hand, initial encounter: Secondary | ICD-10-CM | POA: Diagnosis not present

## 2021-03-15 DIAGNOSIS — L03113 Cellulitis of right upper limb: Secondary | ICD-10-CM | POA: Diagnosis not present

## 2021-04-05 DIAGNOSIS — M25512 Pain in left shoulder: Secondary | ICD-10-CM | POA: Diagnosis not present

## 2021-04-06 DIAGNOSIS — U071 COVID-19: Secondary | ICD-10-CM | POA: Diagnosis not present

## 2021-04-11 DIAGNOSIS — E7800 Pure hypercholesterolemia, unspecified: Secondary | ICD-10-CM | POA: Diagnosis not present

## 2021-04-11 DIAGNOSIS — Z79899 Other long term (current) drug therapy: Secondary | ICD-10-CM | POA: Diagnosis not present

## 2021-04-11 DIAGNOSIS — S61411D Laceration without foreign body of right hand, subsequent encounter: Secondary | ICD-10-CM | POA: Diagnosis not present

## 2021-04-11 DIAGNOSIS — E559 Vitamin D deficiency, unspecified: Secondary | ICD-10-CM | POA: Diagnosis not present

## 2021-04-11 DIAGNOSIS — U071 COVID-19: Secondary | ICD-10-CM | POA: Diagnosis not present

## 2021-04-11 DIAGNOSIS — Z862 Personal history of diseases of the blood and blood-forming organs and certain disorders involving the immune mechanism: Secondary | ICD-10-CM | POA: Diagnosis not present

## 2021-04-14 DIAGNOSIS — Z79899 Other long term (current) drug therapy: Secondary | ICD-10-CM | POA: Diagnosis not present

## 2021-04-14 DIAGNOSIS — I4892 Unspecified atrial flutter: Secondary | ICD-10-CM | POA: Diagnosis not present

## 2021-04-14 DIAGNOSIS — E559 Vitamin D deficiency, unspecified: Secondary | ICD-10-CM | POA: Diagnosis not present

## 2021-04-14 DIAGNOSIS — Z0000 Encounter for general adult medical examination without abnormal findings: Secondary | ICD-10-CM | POA: Diagnosis not present

## 2021-04-14 DIAGNOSIS — E7800 Pure hypercholesterolemia, unspecified: Secondary | ICD-10-CM | POA: Diagnosis not present

## 2021-04-14 DIAGNOSIS — D6869 Other thrombophilia: Secondary | ICD-10-CM | POA: Diagnosis not present

## 2021-04-25 DIAGNOSIS — M25512 Pain in left shoulder: Secondary | ICD-10-CM | POA: Diagnosis not present

## 2021-05-20 DIAGNOSIS — L249 Irritant contact dermatitis, unspecified cause: Secondary | ICD-10-CM | POA: Diagnosis not present

## 2021-06-21 DIAGNOSIS — L821 Other seborrheic keratosis: Secondary | ICD-10-CM | POA: Diagnosis not present

## 2021-06-21 DIAGNOSIS — D692 Other nonthrombocytopenic purpura: Secondary | ICD-10-CM | POA: Diagnosis not present

## 2021-06-21 DIAGNOSIS — Z85828 Personal history of other malignant neoplasm of skin: Secondary | ICD-10-CM | POA: Diagnosis not present

## 2021-06-21 DIAGNOSIS — L853 Xerosis cutis: Secondary | ICD-10-CM | POA: Diagnosis not present

## 2021-06-21 DIAGNOSIS — D1801 Hemangioma of skin and subcutaneous tissue: Secondary | ICD-10-CM | POA: Diagnosis not present

## 2021-09-28 ENCOUNTER — Emergency Department (HOSPITAL_COMMUNITY)
Admission: EM | Admit: 2021-09-28 | Discharge: 2021-09-29 | Disposition: A | Discharge status: Home / Self Care | Payer: Medicare PPO | Attending: Emergency Medicine | Admitting: Emergency Medicine

## 2021-09-28 DIAGNOSIS — K4090 Unilateral inguinal hernia, without obstruction or gangrene, not specified as recurrent: Secondary | ICD-10-CM | POA: Diagnosis not present

## 2021-09-28 DIAGNOSIS — K7689 Other specified diseases of liver: Secondary | ICD-10-CM | POA: Diagnosis not present

## 2021-09-28 DIAGNOSIS — N3289 Other specified disorders of bladder: Secondary | ICD-10-CM | POA: Diagnosis not present

## 2021-09-28 DIAGNOSIS — N189 Chronic kidney disease, unspecified: Secondary | ICD-10-CM | POA: Diagnosis not present

## 2021-09-28 DIAGNOSIS — N281 Cyst of kidney, acquired: Secondary | ICD-10-CM | POA: Diagnosis not present

## 2021-09-28 DIAGNOSIS — Z7901 Long term (current) use of anticoagulants: Secondary | ICD-10-CM | POA: Insufficient documentation

## 2021-09-28 DIAGNOSIS — R1030 Lower abdominal pain, unspecified: Secondary | ICD-10-CM | POA: Diagnosis present

## 2021-09-29 ENCOUNTER — Encounter (HOSPITAL_COMMUNITY): Payer: Self-pay | Admitting: Emergency Medicine

## 2021-09-29 ENCOUNTER — Emergency Department (HOSPITAL_COMMUNITY): Payer: Medicare PPO

## 2021-09-29 DIAGNOSIS — K7689 Other specified diseases of liver: Secondary | ICD-10-CM | POA: Diagnosis not present

## 2021-09-29 DIAGNOSIS — N3289 Other specified disorders of bladder: Secondary | ICD-10-CM | POA: Diagnosis not present

## 2021-09-29 DIAGNOSIS — N281 Cyst of kidney, acquired: Secondary | ICD-10-CM | POA: Diagnosis not present

## 2021-09-29 LAB — COMPREHENSIVE METABOLIC PANEL
ALT: 25 U/L (ref 0–44)
AST: 28 U/L (ref 15–41)
Albumin: 3.9 g/dL (ref 3.5–5.0)
Alkaline Phosphatase: 48 U/L (ref 38–126)
Anion gap: 4 — ABNORMAL LOW (ref 5–15)
BUN: 16 mg/dL (ref 8–23)
CO2: 29 mmol/L (ref 22–32)
Calcium: 9.3 mg/dL (ref 8.9–10.3)
Chloride: 107 mmol/L (ref 98–111)
Creatinine, Ser: 1.04 mg/dL (ref 0.61–1.24)
GFR, Estimated: >60 mL/min (ref >60)
Glucose, Bld: 127 mg/dL — ABNORMAL HIGH (ref 70–99)
Potassium: 4.1 mmol/L (ref 3.5–5.1)
Sodium: 140 mmol/L (ref 135–145)
Total Bilirubin: 0.7 mg/dL (ref 0.3–1.2)
Total Protein: 6.2 g/dL — ABNORMAL LOW (ref 6.5–8.1)

## 2021-09-29 LAB — CBC
HCT: 42.4 % (ref 39.0–52.0)
Hemoglobin: 13.9 g/dL (ref 13.0–17.0)
MCH: 33.8 pg (ref 26.0–34.0)
MCHC: 32.8 g/dL (ref 30.0–36.0)
MCV: 103.2 fL — ABNORMAL HIGH (ref 80.0–100.0)
Platelets: 205 10*3/uL (ref 150–400)
RBC: 4.11 MIL/uL — ABNORMAL LOW (ref 4.22–5.81)
RDW: 12.3 % (ref 11.5–15.5)
WBC: 6.7 10*3/uL (ref 4.0–10.5)
nRBC: 0 % (ref 0.0–0.2)

## 2021-09-29 LAB — LIPASE, BLOOD: Lipase: 36 U/L (ref 11–51)

## 2021-09-29 MED ORDER — IOHEXOL 300 MG/ML  SOLN
100.0000 mL | Freq: Once | INTRAMUSCULAR | Status: AC | PRN
  Administered 2021-09-29: 100 mL via INTRAVENOUS

## 2021-09-29 MED ORDER — SODIUM CHLORIDE (PF) 0.9 % IJ SOLN
INTRAMUSCULAR | Status: AC
  Filled 2021-09-29: qty 50

## 2021-09-29 NOTE — ED Provider Notes (Addendum)
[facility]-EMERGENCY DEPT Provider Note   CSN: [id number] Arrival date & time: 09/28/21  2357     History  Chief Complaint  Patient presents with   Groin Pain    Richard Perkins is a 76 y.o. male.  The history is provided by the patient.  Groin Pain  He has history of hyperlipidemia, atrial flutter anticoagulated on apixaban, chronic kidney disease and comes in because of pain at the site of a known inguinal hernia.  He states he has had a hernia for about 10 years.  He did initially see a surgeon who advised him that surgery was not necessary but that it could be done at any time.  He was doing well until tonight when he noted sudden onset of moderate to severe pain in the hernia.  He has not had any nausea or vomiting.  He denies any fever or chills.   Home Medications Prior to Admission medications   Medication Sig Start Date End Date Taking? Authorizing Provider  acetaminophen (TYLENOL) 650 MG CR tablet Take 650 mg by mouth every 8 (eight) hours as needed for pain.   Yes [provider]  apixaban (ELIQUIS) 5 MG TABS tablet Take 1 tablet (5 mg total) by mouth 2 (two) times daily. 05/31/20  Yes Turner, Cornelious Bryant, MD  atorvastatin (LIPITOR) 40 MG tablet Take 1 tablet (40 mg total) by mouth every evening. 01/22/18  Yes Dunn, Dayna N, PA-C  ergocalciferol (VITAMIN D2) 1.25 MG (50000 UT) capsule Take 50,000 Units by mouth once a week.   Yes [provider]  Magnesium 500 MG CAPS Take 500 mg by mouth every evening. LEG CRAMPS AT NIGHT   Yes [provider]  tamsulosin (FLOMAX) 0.4 MG CAPS capsule Take 0.4 mg by mouth at bedtime.   Yes [provider]      Allergies    Patient has no known allergies.    Review of Systems   Review of Systems  All other systems reviewed and are negative.   Physical Exam Updated Vital Signs BP 100/61   Pulse (!) 56   Temp 98.3 F (36.8 C) (Oral)   Resp 18   Ht 5' 8.5" (1.74 m)   Wt 60.3 kg    SpO2 98%   BMI 19.93 kg/m  Physical Exam Vitals and nursing note reviewed.   76 year old male, resting comfortably and in no acute distress. Vital signs are normal. Oxygen saturation is 98%, which is normal. Head is normocephalic and atraumatic. PERRLA, EOMI. Oropharynx is clear. Neck is nontender and supple without adenopathy or JVD. Back is nontender and there is no CVA tenderness. Lungs are clear without rales, wheezes, or rhonchi. Chest is nontender. Heart has regular rate and rhythm without murmur. Abdomen is soft, flat, nontender.  Small right-sided direct inguinal hernia is present which is mildly tender but not indurated. Extremities have no cyanosis or edema, full range of motion is present. Skin is warm and dry without rash. Neurologic: Mental status is normal, cranial nerves are intact, moves all extremities equally.  ED Results / Procedures / Treatments   Labs (all labs ordered are listed, but only abnormal results are displayed) Labs Reviewed  COMPREHENSIVE METABOLIC PANEL - Abnormal; Notable for the following components:      Result Value   Glucose, Bld 127 (*)    Total Protein 6.2 (*)    Anion gap 4 (*)    All other components within normal limits  CBC -  Abnormal; Notable for the following components:   RBC 4.11 (*)    MCV 103.2 (*)    All other components within normal limits  LIPASE, BLOOD  URINALYSIS, ROUTINE W REFLEX MICROSCOPIC    EKG None  Radiology CT ABDOMEN PELVIS W CONTRAST  Result Date: 09/29/2021 CLINICAL DATA:  76 year old male with right lower quadrant pain in an area of chronic inguinal swelling. EXAM: CT ABDOMEN AND PELVIS WITH CONTRAST TECHNIQUE: Multidetector CT imaging of the abdomen and pelvis was performed using the standard protocol following bolus administration of intravenous contrast. RADIATION DOSE REDUCTION: This exam was performed according to the departmental dose-optimization program which includes automated exposure control,  adjustment of the mA and/or kV according to patient size and/or use of iterative reconstruction technique. CONTRAST:  OMNIPAQUE IOHEXOL 300 MG/ML  SOLN COMPARISON:  None Available. FINDINGS: Lower chest: Mild bilateral lower lobe scarring and/or atelectasis. Otherwise negative. No pericardial or pleural effusion. Hepatobiliary: Multiple small but circumscribed low-density areas in the liver are most likely benign cysts such as on series 2, image 14 (no follow-up imaging recommended). Small calcified granuloma also at the liver dome. Contracted gallbladder (series 2, image 22). Pancreas: Negative. Spleen: Negative. Adrenals/Urinary Tract: Normal adrenal glands. Symmetric renal enhancement and contrast excretion. Simple fluid density, benign right renal cysts (no follow-up imaging recommended). No hydroureter. However, the bladder is abnormally distended. Estimated bladder volume up to 700 mL. And there is evidence of a broad-based posterior bladder diverticulum on series 2, image 63. No perivesical stranding. Incidental pelvic phleboliths. No urinary calculus identified. Stomach/Bowel: Mild retained stool in large bowel throughout the abdomen. Redundant sigmoid. No dilated or inflamed large bowel loops. Appendix not clearly delineated. But no pericecal inflammation. Loop of terminal ileum suspected on coronal image 62. Fluid-filled although nondilated small bowel throughout the lower abdomen. Right inguinal hernia although no definite herniated bowel loops. No free air or free fluid identified. Stomach is fluid-filled. Duodenum is decompressed. Vascular/Lymphatic: Aortoiliac calcified atherosclerosis. Major arterial structures are patent. Portal venous system is patent. No lymphadenopathy identified. Reproductive: Surgical clips or brachytherapy sequelae at the prostate. Negative left inguinal canal. Evidence of a right inguinal hernia containing fat and a small volume of simple density fluid. No definite  herniated bowel. See series 2, image 73 and coronal image 56. Other: No pelvic free fluid. Musculoskeletal: No acute osseous abnormality identified. IMPRESSION: 1. Soft tissue thickening and small volume of free fluid along a small fat containing right inguinal hernia might indicate the hernia is incarcerated. No herniated bowel identified. 2. Possible Ileus, but no evidence of mechanical bowel obstruction. Appendix cannot be delineated. No discrete bowel inflammation. 3. Abnormally distended bladder (700 mL), query urinary retention. 4. Aortic Atherosclerosis (ICD10-I70.0). Electronically Signed   By: Odessa Fleming M.D.   On: 09/29/2021 04:06    Procedures Hernia reduction  Date/Time: 09/29/2021 3:08 AM  Performed by: Dione Booze, MD Authorized by: Dione Booze, MD  Consent: Verbal consent obtained. Written consent not obtained. Risks and benefits: risks, benefits and alternatives were discussed Consent given by: patient Patient understanding: patient states understanding of the procedure being performed Patient consent: the patient's understanding of the procedure matches consent given Procedure consent: procedure consent matches procedure scheduled Relevant documents: relevant documents present and verified Test results: test results available and properly labeled Site marked: the operative site was marked Required items: required blood products, implants, devices, and special equipment available Patient identity confirmed: verbally with patient and arm band Time out: Immediately prior to procedure a "  time out" was called to verify the correct patient, procedure, equipment, support staff and site/side marked as required. Local anesthesia used: no  Anesthesia: Local anesthesia used: no  Sedation: Patient sedated: no  Patient tolerance: patient tolerated the procedure well with no immediate complications       Medications Ordered in ED Medications  iohexol (OMNIPAQUE) 300 MG/ML  solution 100 mL (100 mLs Intravenous Contrast Given 09/29/21 0338)  sodium chloride (PF) 0.9 % injection (  Given by Other 09/29/21 0408)    ED Course/ Medical Decision Making/ A&P                           Medical Decision Making Amount and/or Complexity of Data Reviewed Labs: ordered. Radiology: ordered.  Risk Prescription drug management.   Right inguinal hernia which is not clinically incarcerated.  The hernia that was there was reduced and I have ordered a CT of abdomen and pelvis to evaluate the hernia and also rule out concurrent conditions such as appendicitis.  I have reviewed and interpreted the laboratory tests and my interpretation is mildly elevated random glucose level, macrocytosis without anemia.  CT shows fat in the hernia sac but no bowel.  I have independently reviewed the images, and agree with radiologist interpretation.  Patient states that he is feeling much better at this point, although there is still some slight tenderness at the site of the hernia.  At this point, I do feel he is safe for discharge and he is referred to general surgery for evaluation for elective herniorrhaphy.  Return precautions discussed.  Final Clinical Impression(s) / ED Diagnoses Final diagnoses:  Right inguinal hernia  Chronic anticoagulation    Rx / DC Orders ED Discharge Orders     None         Delora Fuel, MD [id number]    Delora Fuel, [place] [id number] [phone]

## 2021-09-29 NOTE — Discharge Instructions (Signed)
Return if pain is getting worse or if you start vomiting. °

## 2021-09-29 NOTE — ED Triage Notes (Signed)
Pt states he has had what he describes as "inguinal swelling" on the right for approximately 10 years. Tonight while reading developed pain in that area. No n/v but did state he was diaphoretic at the time.  Tried to go to sleep but could not find a position of comfort.

## 2021-10-04 DIAGNOSIS — Z681 Body mass index (BMI) 19 or less, adult: Secondary | ICD-10-CM | POA: Diagnosis not present

## 2021-10-04 DIAGNOSIS — Z03818 Encounter for observation for suspected exposure to other biological agents ruled out: Secondary | ICD-10-CM | POA: Diagnosis not present

## 2021-10-04 DIAGNOSIS — Z20822 Contact with and (suspected) exposure to covid-19: Secondary | ICD-10-CM | POA: Diagnosis not present

## 2021-10-05 ENCOUNTER — Telehealth: Payer: Self-pay | Admitting: *Deleted

## 2021-10-12 DIAGNOSIS — K4090 Unilateral inguinal hernia, without obstruction or gangrene, not specified as recurrent: Secondary | ICD-10-CM | POA: Diagnosis not present

## 2021-10-17 ENCOUNTER — Telehealth: Payer: Self-pay | Admitting: Cardiology

## 2021-10-17 ENCOUNTER — Telehealth: Payer: Self-pay | Admitting: *Deleted

## 2021-10-17 NOTE — Telephone Encounter (Signed)
Primary Cardiologist:Traci Turner, MD  Chart reviewed as part of pre-operative protocol coverage. Because of Richard Perkins's past medical history and time since last visit, he/she will require a follow-up visit in order to better assess preoperative cardiovascular risk.  Pre-op covering staff: - Please schedule appointment and call patient to inform them. - Please contact requesting surgeon's office via preferred method (i.e, phone, fax) to inform them of need for appointment prior to surgery.  This message also routed to pharmacy pool  for input on holding anticoagulant agent as requested below so that this information is available at time of patient's appointment.   Emmaline Life, NP-C  10/17/2021, 11:18 AM [address] Office [phone] Fax [phone]

## 2021-10-17 NOTE — Telephone Encounter (Signed)
   Pre-operative Risk Assessment    Patient Name: Richard Perkins  DOB: 1945/02/17 MRN: [id number]      Request for Surgical Clearance    Procedure:    Inguinal hernia repair   Date of Surgery:  Clearance 10/27/21                                 Surgeon:  Dr. Donnamae Jude Surgeon's Group or Practice Name:  [facility] in [place], [place]  Phone number:  [phone] Fax number:  [phone]   Type of Clearance Requested:   - Medical  - Pharmacy:  Hold Apixaban (Eliquis) not sure how many days    Type of Anesthesia:  General    Additional requests/questions:   they will also fax over additional requests to [phone]  SignedDivit, Stipp   10/17/2021, 10:40 AM

## 2021-10-17 NOTE — Telephone Encounter (Signed)
Pt has been scheduled for a tele visit, 10/20/21.  Consent on file / medications reconciled.  

## 2021-10-17 NOTE — Telephone Encounter (Signed)
Pt has been scheduled for a tele visit, 10/20/21.  Consent on file / medications reconciled.     Patient Consent for Virtual Visit        Richard Perkins has provided verbal consent on 10/17/2021 for a virtual visit (video or telephone).   CONSENT FOR VIRTUAL VISIT FOR:  Richard Perkins  By participating in this virtual visit I agree to the following:  I hereby voluntarily request, consent and authorize [facility] and its employed or contracted physicians, physician assistants, nurse practitioners or other licensed health care professionals (the Practitioner), to provide me with telemedicine health care services (the "Services") as deemed necessary by the treating Practitioner. I acknowledge and consent to receive the Services by the Practitioner via telemedicine. I understand that the telemedicine visit will involve communicating with the Practitioner through live audiovisual communication technology and the disclosure of certain medical information by electronic transmission. I acknowledge that I have been given the opportunity to request an in-person assessment or other available alternative prior to the telemedicine visit and am voluntarily participating in the telemedicine visit.  I understand that I have the right to withhold or withdraw my consent to the use of telemedicine in the course of my care at any time, without affecting my right to future care or treatment, and that the Practitioner or I may terminate the telemedicine visit at any time. I understand that I have the right to inspect all information obtained and/or recorded in the course of the telemedicine visit and may receive copies of available information for a reasonable fee.  I understand that some of the potential risks of receiving the Services via telemedicine include:  Delay or interruption in medical evaluation due to technological equipment failure or disruption; Information transmitted may not be sufficient  (e.g. poor resolution of images) to allow for appropriate medical decision making by the Practitioner; and/or  In rare instances, security protocols could fail, causing a breach of personal health information.  Furthermore, I acknowledge that it is my responsibility to provide information about my medical history, conditions and care that is complete and accurate to the best of my ability. I acknowledge that Practitioner's advice, recommendations, and/or decision may be based on factors not within their control, such as incomplete or inaccurate data provided by me or distortions of diagnostic images or specimens that may result from electronic transmissions. I understand that the practice of medicine is not an exact science and that Practitioner makes no warranties or guarantees regarding treatment outcomes. I acknowledge that a copy of this consent can be made available to me via my patient portal ([facility]), or I can request a printed copy by calling the office of [facility].    I understand that my insurance will be billed for this visit.   I have read or had this consent read to me. I understand the contents of this consent, which adequately explains the benefits and risks of the Services being provided via telemedicine.  I have been provided ample opportunity to ask questions regarding this consent and the Services and have had my questions answered to my satisfaction. I give my informed consent for the services to be provided through the use of telemedicine in my medical care

## 2021-10-18 NOTE — Telephone Encounter (Signed)
   Pre-operative Risk Assessment    Patient Name: Richard Perkins  DOB: 10-Jun-1945 MRN: [id number]      Request for Surgical Clearance    Procedure:   Inguinal Hernia Repair  Date of Surgery:  Clearance 10/27/21                                 Surgeon:  Dr. Donnamae Jude Surgeon's Group or Practice Name:  [facility], [place] Phone number:  [phone] Fax number:  [phone]   Type of Clearance Requested:   - Pharmacy:  Hold Apixaban (Eliquis)     Type of Anesthesia:  Not Indicated   Additional requests/questions:   Caller wants anesthesia clearance to hold Eliquis.  Signed, Heloise Beecham   10/18/2021, 10:31 AM

## 2021-10-18 NOTE — Telephone Encounter (Signed)
Patient with diagnosis of aflutter on Eliquis for anticoagulation.    Procedure:   Inguinal hernia repair  Date of procedure: 10/27/21   CHA2DS2-VASc Score = 3   This indicates a 3.2% annual risk of stroke. The patient's score is based upon: CHF History: 0 HTN History: 0 Diabetes History: 0 Stroke History: 0 Vascular Disease History: 1 Age Score: 2 Gender Score: 0      CrCl 51 ml/min  Per office protocol, patient can hold Eliquis for 2 days prior to procedure.    **This guidance is not considered finalized until pre-operative APP has relayed final recommendations.**

## 2021-10-20 ENCOUNTER — Ambulatory Visit: Payer: Medicare PPO | Admitting: General Practice

## 2021-10-20 DIAGNOSIS — R001 Bradycardia, unspecified: Secondary | ICD-10-CM | POA: Diagnosis not present

## 2021-10-20 DIAGNOSIS — Z01818 Encounter for other preprocedural examination: Secondary | ICD-10-CM | POA: Diagnosis not present

## 2021-10-20 NOTE — Telephone Encounter (Signed)
Chloe, from our scheduling team sent a message the pt is calling back about appt. In the conversation the pt states he is not coming back to [place] and is now living in [place]. I did confirm with Coletta Memos, FNP if this is the case the pt will need to establish with a cardiologist in [place]. Which then the surgeon office will have to get clearance from cardiologist in [place]. I will fax these notes to requesting office as well.

## 2021-10-20 NOTE — Telephone Encounter (Signed)
A tele visit could not be done since [place] over [place].

## 2021-10-20 NOTE — Progress Notes (Signed)
Error

## 2021-10-20 NOTE — Telephone Encounter (Addendum)
Richardson Landry from [facility] in [place], called to check on status of clearance.  I advised him we attempted to contact patient, as patient needs to be seen in the office.  He said patient is in [place] and would not be able to come in.

## 2021-10-20 NOTE — Telephone Encounter (Signed)
Per Coletta Memos, FNP pre op provider today, stated the pt should had been scheduled for an in office appt and not a tele appt. I was asked to call the pt and change appt to in office as the pt has not been seen since 05/2020.   I left a message for the pt to call the office ASAP in regard to the tele appt today, which should had been an in office appt. I left message for the pt that we can see him today at 2:45 with Ambrose Pancoast, NP.  Left message for the pt to call the office back ASAP and ask to s/w the pre op team.

## 2021-10-31 ENCOUNTER — Ambulatory Visit: Payer: Medicare PPO | Admitting: Physician Assistant

## 2021-11-01 NOTE — Progress Notes (Unsigned)
Office Visit    Patient Name: Richard Perkins Date of Encounter: 11/02/2021  PCP:  Lawerance Cruel, [facility]  Cardiologist:  Fransico Him, MD  Advanced Practice Provider:  No care team member to display Electrophysiologist:  None   HPI    Richard Perkins is a 76 y.o. male with a past medical history significant for atrial flutter, CKD stage II, hypotension, hyperlipidemia, NSTEMI 01/2019 without significant CAD (unclear etiology) presents today for preop cardiac clearance evaluation.  At the time of his NSTEMI it was thought to be embolic from his atrial flutter versus vasospasm versus ruptured plaque with recanalization.  He was started on Eliquis and has done well since.  He was on a statin for hyperlipidemia.  He was last seen 05/31/2020 by Dr. Radford Pax.  He was seen for follow-up with his atrial fibrillation and hyperlipidemia.  He admitted to salting his foods.  He was compliant with his medications and tolerating his meds.  Today, he has been feeling well from a cardiac standpoint. He tells me he had a cardiac cath and no CAD. No reoccurrence of afib. BP is usually lower as well as heart rate.  He is asymptomatic from this.  He plans to have a hernia repair in [place] where he spends half of his time.  He denies chest pain or shortness of breath.  He remains compliant on Eliquis for anticoagulation.  He has not had any significant bleeding.  Overall, doing well from a cardiovascular standpoint.  He remains active and enjoys biking.  He is scored a 6.36 METS on the DASI.  This well exceeds the 4.0 METS minimum requirement.  We discussed holding his Eliquis 2 days prior to his procedure.  Please resume when medic safe to do so.  Past Medical History    Past Medical History:  Diagnosis Date   Atrial flutter ([place] [facility])    a. dx 01/2018, rate controlled.   CKD (chronic kidney disease), stage II    Hyperlipidemia    Hypotension    Appears baseline    NSTEMI (non-ST elevated myocardial infarction) ([facility])    a. 01/2018 - cath without significant CAD, unclear etiology - ? transient plaque rupture with recanalization vs spasm.   Urinary retention    Past Surgical History:  Procedure Laterality Date   BASAL CELL CARCINOMA EXCISION     COLONOSCOPY WITH PROPOFOL N/A 03/29/2015   Procedure: COLONOSCOPY WITH PROPOFOL;  Surgeon: Garlan Fair, MD;  Location: [facility];  Service: Endoscopy;  Laterality: N/A;   KNEE ARTHROSCOPY W/ MENISCAL REPAIR     LEFT HEART CATH AND CORONARY ANGIOGRAPHY N/A 01/21/2018   Procedure: LEFT HEART CATH AND CORONARY ANGIOGRAPHY;  Surgeon: Nelva Bush, MD;  Location: [facility];  Service: Cardiovascular;  Laterality: N/A;   VARICOSE VEIN SURGERY      Allergies  No Known Allergies  EKGs/Labs/Other Studies Reviewed:   The following studies were reviewed today: Cardiac catheterization 01/21/2018  Left Main  Vessel is large. Vessel is angiographically normal.    Left Anterior Descending  Vessel is moderate in size. The vessel exhibits minimal luminal irregularities.    First Diagonal Branch  Vessel is moderate in size.    Second Diagonal Branch  Vessel is small in size.    Third Diagonal Branch  Vessel is small in size.    Left Circumflex  Vessel is moderate in size. Vessel is angiographically normal.    First Obtuse  Marginal Branch  Vessel is large in size.    Right Coronary Artery  Vessel is moderate in size. Vessel is angiographically normal.    Intervention   No interventions have been documented.   Wall Motion  Resting                Left Heart  Left Ventricle The left ventricular size is normal. The left ventricular systolic function is normal. LV end diastolic pressure is normal. The left ventricular ejection fraction is 55-65% by visual estimate. No regional wall motion abnormalities.  Aortic Valve There is no aortic valve stenosis.   Coronary  Diagrams  Diagnostic Dominance: Right  Intervention   EKG:  EKG is  ordered today.  The ekg ordered today demonstrates sinus bradycardia, rate 50 bpm  Recent Labs: 09/29/2021: ALT 25; BUN 16; Creatinine, Ser 1.04; Hemoglobin 13.9; Platelets 205; Potassium 4.1; Sodium 140  Recent Lipid Panel    Component Value Date/Time   CHOL 155 10/23/2018 0805   TRIG 71 10/23/2018 0805   HDL 69 10/23/2018 0805   CHOLHDL 2.2 10/23/2018 0805   CHOLHDL 3.2 01/19/2018 0343   VLDL 18 01/19/2018 0343   LDLCALC 72 10/23/2018 0805    Risk Assessment/Calculations:   CHA2DS2-VASc Score = 3   This indicates a 3.2% annual risk of stroke. The patient's score is based upon: CHF History: 0 HTN History: 0 Diabetes History: 0 Stroke History: 0 Vascular Disease History: 1 Age Score: 2 Gender Score: 0     Home Medications   Current Meds  Medication Sig   acetaminophen (TYLENOL) 650 MG CR tablet Take 650 mg by mouth every 8 (eight) hours as needed for pain.   apixaban (ELIQUIS) 5 MG TABS tablet Take 1 tablet (5 mg total) by mouth 2 (two) times daily.   atorvastatin (LIPITOR) 40 MG tablet Take 1 tablet (40 mg total) by mouth every evening.   ergocalciferol (VITAMIN D2) 1.25 MG (50000 UT) capsule Take 50,000 Units by mouth once a week.   Magnesium 500 MG CAPS Take 500 mg by mouth every evening. LEG CRAMPS AT NIGHT   tamsulosin (FLOMAX) 0.4 MG CAPS capsule Take 0.4 mg by mouth at bedtime.     Review of Systems      All other systems reviewed and are otherwise negative except as noted above.  Physical Exam    VS:  BP 122/74   Pulse (!) 50   Ht 5' 8.5" (1.74 m)   Wt 134 lb 6.4 oz (61 kg)   SpO2 95%   BMI 20.14 kg/m  , BMI Body mass index is 20.14 kg/m.  Wt Readings from Last 3 Encounters:  11/02/21 134 lb 6.4 oz (61 kg)  09/29/21 133 lb (60.3 kg)  05/31/20 138 lb 12.8 oz (63 kg)     GEN: Well nourished, well developed, in no acute distress. HEENT: normal. Neck: Supple, no JVD,  carotid bruits, or masses. Cardiac: RRR (sinus bradycardia), no murmurs, rubs, or gallops. No clubbing, cyanosis,  1+ pitting edema R > L edema.  Radials/PT 2+ and equal bilaterally.  Respiratory:  Respirations regular and unlabored, clear to auscultation bilaterally. GI: Soft, nontender, nondistended. MS: No deformity or atrophy. Skin: Warm and dry, no rash. Neuro:  Strength and sensation are intact. Psych: Normal affect.  Assessment & Plan    Preop Op evaluation  Mr. Burley's perioperative risk of a major cardiac event is 0.9% according to the Revised Cardiac Risk Index (RCRI).  Therefore, he is at low  risk for perioperative complications.   His functional capacity is good at 6.36 METs according to the [facility] Activity Status Index (DASI). Recommendations: According to ACC/AHA guidelines, no further cardiovascular testing needed.  The patient may proceed to surgery at acceptable risk.   Antiplatelet and/or Anticoagulation Recommendations:  Eliquis (Apixaban) can be held for 2 days prior to surgery.  Please resume post op when felt to be safe.    History of NSTEMI -No chest pain or shortness of breath -Continue current medications including Eliquis 5 mg twice daily, Lipitor 40 mg daily, magnesium 500 mg at night  Paroxysmal atrial flutter -no recent issues -Asymptomatic at this time -He is compliant with Eliquis without any bleeding issues  Hyperlipidemia -Dr. 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  Signed, Elgie Collard, PA-C 11/02/2021, 2:21 PM  [facility]

## 2021-11-02 ENCOUNTER — Encounter: Payer: Self-pay | Admitting: Physician Assistant

## 2021-11-02 ENCOUNTER — Ambulatory Visit: Payer: Medicare PPO | Attending: Physician Assistant | Admitting: Physician Assistant

## 2021-11-02 VITALS — BP 122/74 | HR 50 | Ht 68.5 in | Wt 134.4 lb

## 2021-11-02 DIAGNOSIS — R252 Cramp and spasm: Secondary | ICD-10-CM

## 2021-11-02 DIAGNOSIS — I483 Typical atrial flutter: Secondary | ICD-10-CM | POA: Diagnosis not present

## 2021-11-02 DIAGNOSIS — I214 Non-ST elevation (NSTEMI) myocardial infarction: Secondary | ICD-10-CM

## 2021-11-02 DIAGNOSIS — E7800 Pure hypercholesterolemia, unspecified: Secondary | ICD-10-CM | POA: Diagnosis not present

## 2021-11-02 DIAGNOSIS — Z01810 Encounter for preprocedural cardiovascular examination: Secondary | ICD-10-CM | POA: Diagnosis not present

## 2021-11-02 NOTE — Patient Instructions (Signed)
Medication Instructions:  1.Regarding pre-op clearance, take your last dose of Eliquis on 11/21/2021, then resume as instructed by surgeon *If you need a refill on your cardiac medications before your next appointment, please call your pharmacy*   Lab Work: None If you have labs (blood work) drawn today and your tests are completely normal, you will receive your results only by: [place] (if you have MyChart) OR A paper copy in the mail If you have any lab test that is abnormal or we need to change your treatment, we will call you to review the results.   Follow-Up: At [facility], you and your health needs are our priority.  As part of our continuing mission to provide you with exceptional heart care, we have created designated Provider Care Teams.  These Care Teams include your primary Cardiologist (physician) and Advanced Practice Providers (APPs -  Physician Assistants and Nurse Practitioners) who all work together to provide you with the care you need, when you need it.  Your next appointment:   7 month(s)  The format for your next appointment:   In Person  Provider:   Fransico Him, MD    Important Information About Sugar

## 2021-11-03 ENCOUNTER — Telehealth: Payer: Self-pay | Admitting: Cardiology

## 2021-11-03 NOTE — Telephone Encounter (Signed)
Patient states he received a call about an hour after appointment yesterday, 10/18, with Nicholes Rough PA. He states he did not receive a voicemail, but assumes the missed call was regarding his clearance--the clearance has been received by the requesting office, per patient. Patient requested a MyChart message instead of a call back.

## 2021-11-08 NOTE — Addendum Note (Signed)
Addended by: Eryn Krejci I on: 11/08/2021 08:00 AM   Modules accepted: Orders  

## 2021-11-24 ENCOUNTER — Encounter: Payer: Self-pay | Admitting: Cardiology

## 2021-11-24 DIAGNOSIS — Z539 Procedure and treatment not carried out, unspecified reason: Secondary | ICD-10-CM | POA: Diagnosis not present

## 2021-11-24 DIAGNOSIS — K4090 Unilateral inguinal hernia, without obstruction or gangrene, not specified as recurrent: Secondary | ICD-10-CM | POA: Diagnosis not present

## 2021-11-24 DIAGNOSIS — G8929 Other chronic pain: Secondary | ICD-10-CM | POA: Diagnosis not present

## 2021-11-24 DIAGNOSIS — Z538 Procedure and treatment not carried out for other reasons: Secondary | ICD-10-CM | POA: Diagnosis not present

## 2021-11-24 DIAGNOSIS — Z01818 Encounter for other preprocedural examination: Secondary | ICD-10-CM | POA: Diagnosis not present

## 2021-11-24 DIAGNOSIS — F17200 Nicotine dependence, unspecified, uncomplicated: Secondary | ICD-10-CM | POA: Diagnosis not present

## 2021-11-25 NOTE — Telephone Encounter (Signed)
Patient agreed to advisement from Dr. Mayford Knife. Appt made in [facility]. Will send information to the patient over mychart per request.

## 2021-11-26 DIAGNOSIS — I4430 Unspecified atrioventricular block: Secondary | ICD-10-CM | POA: Diagnosis not present

## 2021-11-26 DIAGNOSIS — R9431 Abnormal electrocardiogram [ECG] [EKG]: Secondary | ICD-10-CM | POA: Diagnosis not present

## 2021-11-26 DIAGNOSIS — I4892 Unspecified atrial flutter: Secondary | ICD-10-CM | POA: Diagnosis not present

## 2021-12-06 ENCOUNTER — Ambulatory Visit (HOSPITAL_COMMUNITY)
Admission: RE | Admit: 2021-12-06 | Discharge: 2021-12-06 | Disposition: A | Discharge status: Home / Self Care | Payer: Medicare PPO | Source: Ambulatory Visit | Attending: Physician Assistant | Admitting: Physician Assistant

## 2021-12-06 ENCOUNTER — Encounter (HOSPITAL_COMMUNITY): Payer: Self-pay | Admitting: Physician Assistant

## 2021-12-06 VITALS — BP 110/70 | HR 64 | Ht 68.5 in | Wt 139.6 lb

## 2021-12-06 DIAGNOSIS — I130 Hypertensive heart and chronic kidney disease with heart failure and stage 1 through stage 4 chronic kidney disease, or unspecified chronic kidney disease: Secondary | ICD-10-CM | POA: Diagnosis not present

## 2021-12-06 DIAGNOSIS — N182 Chronic kidney disease, stage 2 (mild): Secondary | ICD-10-CM | POA: Diagnosis not present

## 2021-12-06 DIAGNOSIS — I483 Typical atrial flutter: Secondary | ICD-10-CM

## 2021-12-06 DIAGNOSIS — E785 Hyperlipidemia, unspecified: Secondary | ICD-10-CM | POA: Insufficient documentation

## 2021-12-06 DIAGNOSIS — I4892 Unspecified atrial flutter: Secondary | ICD-10-CM | POA: Diagnosis not present

## 2021-12-06 DIAGNOSIS — I4891 Unspecified atrial fibrillation: Secondary | ICD-10-CM | POA: Diagnosis not present

## 2021-12-06 DIAGNOSIS — Z7901 Long term (current) use of anticoagulants: Secondary | ICD-10-CM | POA: Insufficient documentation

## 2021-12-06 DIAGNOSIS — D6869 Other thrombophilia: Secondary | ICD-10-CM | POA: Insufficient documentation

## 2021-12-06 DIAGNOSIS — I252 Old myocardial infarction: Secondary | ICD-10-CM | POA: Diagnosis not present

## 2021-12-06 DIAGNOSIS — E1122 Type 2 diabetes mellitus with diabetic chronic kidney disease: Secondary | ICD-10-CM | POA: Insufficient documentation

## 2021-12-06 NOTE — H&P (View-Only) (Signed)
Primary Care Physician: Daisy Florooss, Charles Alan, MD Primary Cardiologist: Dr Mayford Knifeurner Primary Electrophysiologist: none Referring Physician: Dr Little Ishikawaurner   Richard Perkins is a 76 y.o. male with a history of CKD, hypotension, HLD, NSTEMI without CAD, atrial flutter who presents for consultation in the [facility].  The patient was initially diagnosed with atrial flutter in 2020 in the setting of NSTEMI. He was rate controlled on no AV nodal agents. He spontaneously converted prior to outpatient follow up. Patient is on Eliquis for a CHADS2VASC score of 3. Patient was [place] and was found to be back in atrial flutter, surgery was cancelled. He remains in rate controlled atrial flutter today. He is fairly asymptomatic but could possibly have a little more dyspnea on exertion than usual.   Today, he denies symptoms of palpitations, chest pain, shortness of breath, orthopnea, PND, lower extremity edema, dizziness, presyncope, syncope, snoring, daytime somnolence, bleeding, or neurologic sequela. The patient is tolerating medications without difficulties and is otherwise without complaint today.    Atrial Fibrillation Risk Factors:  he does not have symptoms or diagnosis of sleep apnea. he does not have a history of rheumatic fever.   he has a BMI of Body mass index is 20.92 kg/m.Marland Kitchen. Filed Weights   12/06/21 1009  Weight: 63.3 kg    Family History  Problem Relation Age of Onset   Heart failure Mother      Atrial Fibrillation Management history:  Previous antiarrhythmic drugs: none Previous cardioversions: none Previous ablations: none CHADS2VASC score: 3 Anticoagulation history: Eliquis   Past Medical History:  Diagnosis Date   Atrial flutter (HCC)    a. dx 01/2018, rate controlled.   CKD (chronic kidney disease), stage II    Hyperlipidemia    Hypotension    Appears baseline   NSTEMI (non-ST elevated myocardial  infarction) (HCC)    a. 01/2018 - cath without significant CAD, unclear etiology - ? transient plaque rupture with recanalization vs spasm.   Urinary retention    Past Surgical History:  Procedure Laterality Date   BASAL CELL CARCINOMA EXCISION     COLONOSCOPY WITH PROPOFOL N/A 03/29/2015   Procedure: COLONOSCOPY WITH PROPOFOL;  Surgeon: Charolett BumpersMartin K Johnson, MD;  Location: [facility];  Service: Endoscopy;  Laterality: N/A;   KNEE ARTHROSCOPY W/ MENISCAL REPAIR     LEFT HEART CATH AND CORONARY ANGIOGRAPHY N/A 01/21/2018   Procedure: LEFT HEART CATH AND CORONARY ANGIOGRAPHY;  Surgeon: Yvonne KendallEnd, Christopher, MD;  [facility];  Service: Cardiovascular;  Laterality: N/A;   VARICOSE VEIN SURGERY      Current Outpatient Medications  Medication Sig Dispense Refill   acetaminophen (TYLENOL) 650 MG CR tablet Take 650 mg by mouth every 8 (eight) hours as needed for pain.     apixaban (ELIQUIS) 5 MG TABS tablet Take 1 tablet (5 mg total) by mouth 2 (two) times daily. 180 tablet 5   atorvastatin (LIPITOR) 40 MG tablet Take 1 tablet (40 mg total) by mouth every evening. 30 tablet 6   ergocalciferol (VITAMIN D2) 1.25 MG (50000 UT) capsule Take 50,000 Units by mouth once a week.     Magnesium 500 MG CAPS Take 500 mg by mouth every evening. LEG CRAMPS AT NIGHT     tamsulosin (FLOMAX) 0.4 MG CAPS capsule Take 0.4 mg by mouth at bedtime.     No current facility-administered medications for this encounter.    No Known Allergies  Social History  Socioeconomic History   Marital status: Single    Spouse name: Not on file   Number of children: Not on file   Years of education: Not on file   Highest education level: Not on file  Occupational History   Not on file  Tobacco Use   Smoking status: Never   Smokeless tobacco: Never   Tobacco comments:    Never smoke 12/06/21  Substance and Sexual Activity   Alcohol use: Yes    Alcohol/week: 10.0 standard drinks of alcohol    Types: 10 Glasses  of wine per week    Comment: 1-2 glasses of wine nightly 12/06/21   Drug use: Yes    Frequency: 1.0 times per week    Comment: edibles   Sexual activity: Not on file  Other Topics Concern   Not on file  Social History Narrative   Not on file   Social Determinants of Health   Financial Resource Strain: Not on file  Food Insecurity: Not on file  Transportation Needs: Not on file  Physical Activity: Not on file  Stress: Not on file  Social Connections: Not on file  Intimate Partner Violence: Not on file     ROS- All systems are reviewed and negative except as per the HPI above.  Physical Exam: Vitals:   12/06/21 1009  BP: 110/70  Pulse: 64  Weight: 63.3 kg  Height: 5' 8.5" (1.74 m)    GEN- The patient is a well appearing elderly male, alert and oriented x 3 today.   Head- normocephalic, atraumatic Eyes-  Sclera clear, conjunctiva pink Ears- hearing intact Oropharynx- clear Neck- supple  Lungs- Clear to ausculation bilaterally, normal work of breathing Heart- irregular rate and rhythm, no murmurs, rubs or gallops  GI- soft, NT, ND, + BS Extremities- no clubbing, cyanosis, or edema MS- no significant deformity or atrophy Skin- no rash or lesion Psych- euthymic mood, full affect Neuro- strength and sensation are intact  Wt Readings from Last 3 Encounters:  12/06/21 63.3 kg  11/02/21 61 kg  09/29/21 60.3 kg    EKG today demonstrates  Typical atrial flutter with variable AV block Vent. rate 64 BPM PR interval * ms QRS duration 82 ms QT/QTcB 392/404 ms  Echo 01/19/18 demonstrated  Left ventricle: The cavity size was normal. Wall thickness was    normal. Systolic function was normal. The estimated ejection    fraction was in the range of 50% to 55%. Wall motion was normal;    there were no regional wall motion abnormalities. The study is    not technically sufficient to allow evaluation of LV diastolic    function.  - Left atrium: The atrium was mildly  dilated.  - Right atrium: The atrium was mildly dilated.   Impressions:   - Normal LV systolic function; mild biatrial enlargement; mild TR.   Epic records are reviewed at length today  CHA2DS2-VASc Score = 3  The patient's score is based upon: CHF History: 0 HTN History: 0 Diabetes History: 0 Stroke History: 0 Vascular Disease History: 1 Age Score: 2 Gender Score: 0       ASSESSMENT AND PLAN: 1. Atrial flutter The patient's CHA2DS2-VASc score is 3, indicating a 3.2% annual risk of stroke.   Patient in rate controlled atrial flutter.  We discussed rhythm control options today including DCCV and flutter ablation. Would avoid class IC with h/o NSTEMI. Bradycardia also limits AAD options. After discussing the risks and benefits, patient agreeable to DCCV. His Eliquis  was resumed 11/24/21. Continue Eliquis 5 mg BID Will not start AV nodal agents given his good rate control and baseline bradycardia in SR.  2. Secondary Hypercoagulable State (ICD10:  D68.69) The patient is at significant risk for stroke/thromboembolism based upon his CHA2DS2-VASc Score of 3.  Continue Apixaban (Eliquis).   3. H/o NSTEMI No angiographically significant CAD No anginal symptoms.   Follow up in the AF clinic post DCCV.    [facility] [address] [place], [place] [address] [phone] 12/06/2021 1:26 PM

## 2021-12-06 NOTE — Patient Instructions (Signed)
Cardioversion scheduled for: Monday, December 18th Come to afib clinic for labs at 9am   - Arrive at the [facility] and go to admitting at 930   - Do not eat or drink anything after midnight the night prior to your procedure.   - Take all your morning medication (except diabetic medications) with a sip of water prior to arrival.   - If you are on weekly OZEMPIC, TRULICITY, MOUNJARO, OR BYDUREON  Hold medication 7 days prior to scheduled procedure/anesthesia.  Restart medication on the normal dosing day after scheduled procedure/anesthesia   - If you are on daily BYETTA, WEGOVY, VICTOZA, ADLYXIN, OR RYBELSUS:   Hold medication 24 hours prior to scheduled procedure/anesthesia.   Restart medication on the following day after scheduled procedure/anesthesia   For those patients who have a scheduled procedure/anesthesia on the same day of the week as their dose, hold the medication on the day of surgery.  They can take their scheduled dose the week before.  **Patients on the above medications scheduled for elective procedures that have not held the medication for the appropriate amount of time are at risk of cancellation or change in the anesthetic plan.   - You will not be able to drive home after your procedure.    - Do NOT miss any doses of your blood thinner - if you should miss a dose please notify our office immediately.   - If you feel as if you go back into normal rhythm prior to scheduled cardioversion, please notify our office immediately.   If your procedure is canceled in the cardioversion suite you will be charged a cancellation fee.

## 2021-12-06 NOTE — Progress Notes (Signed)
  Primary Care Physician: Ross, Charles Alan, MD Primary Cardiologist: Dr Turner Primary Electrophysiologist: none Referring Physician: Dr Turner   Richard Perkins is a 76 y.o. male with a history of CKD, hypotension, HLD, NSTEMI without CAD, atrial flutter who presents for consultation in the [facility].  The patient was initially diagnosed with atrial flutter in 2020 in the setting of NSTEMI. He was rate controlled on no AV nodal agents. He spontaneously converted prior to outpatient follow up. Patient is on Eliquis for a CHADS2VASC score of 3. Patient was scheduled for hernia repair in [place] and was found to be back in atrial flutter, surgery was cancelled. He remains in rate controlled atrial flutter today. He is fairly asymptomatic but could possibly have a little more dyspnea on exertion than usual.   Today, he denies symptoms of palpitations, chest pain, shortness of breath, orthopnea, PND, lower extremity edema, dizziness, presyncope, syncope, snoring, daytime somnolence, bleeding, or neurologic sequela. The patient is tolerating medications without difficulties and is otherwise without complaint today.    Atrial Fibrillation Risk Factors:  he does not have symptoms or diagnosis of sleep apnea. he does not have a history of rheumatic fever.   he has a BMI of Body mass index is 20.92 kg/m.. Filed Weights   12/06/21 1009  Weight: 63.3 kg    Family History  Problem Relation Age of Onset   Heart failure Mother      Atrial Fibrillation Management history:  Previous antiarrhythmic drugs: none Previous cardioversions: none Previous ablations: none CHADS2VASC score: 3 Anticoagulation history: Eliquis   Past Medical History:  Diagnosis Date   Atrial flutter (HCC)    a. dx 01/2018, rate controlled.   CKD (chronic kidney disease), stage II    Hyperlipidemia    Hypotension    Appears baseline   NSTEMI (non-ST elevated myocardial  infarction) (HCC)    a. 01/2018 - cath without significant CAD, unclear etiology - ? transient plaque rupture with recanalization vs spasm.   Urinary retention    Past Surgical History:  Procedure Laterality Date   BASAL CELL CARCINOMA EXCISION     COLONOSCOPY WITH PROPOFOL N/A 03/29/2015   Procedure: COLONOSCOPY WITH PROPOFOL;  Surgeon: Martin K Johnson, MD;  [facility]: [facility];  Service: Endoscopy;  Laterality: N/A;   KNEE ARTHROSCOPY W/ MENISCAL REPAIR     LEFT HEART CATH AND CORONARY ANGIOGRAPHY N/A 01/21/2018   Procedure: LEFT HEART CATH AND CORONARY ANGIOGRAPHY;  Surgeon: End, Christopher, MD;  [facility];  Service: Cardiovascular;  Laterality: N/A;   VARICOSE VEIN SURGERY      Current Outpatient Medications  Medication Sig Dispense Refill   acetaminophen (TYLENOL) 650 MG CR tablet Take 650 mg by mouth every 8 (eight) hours as needed for pain.     apixaban (ELIQUIS) 5 MG TABS tablet Take 1 tablet (5 mg total) by mouth 2 (two) times daily. 180 tablet 5   atorvastatin (LIPITOR) 40 MG tablet Take 1 tablet (40 mg total) by mouth every evening. 30 tablet 6   ergocalciferol (VITAMIN D2) 1.25 MG (50000 UT) capsule Take 50,000 Units by mouth once a week.     Magnesium 500 MG CAPS Take 500 mg by mouth every evening. LEG CRAMPS AT NIGHT     tamsulosin (FLOMAX) 0.4 MG CAPS capsule Take 0.4 mg by mouth at bedtime.     No current facility-administered medications for this encounter.    No Known Allergies  Social History     Socioeconomic History   Marital status: Single    Spouse name: Not on file   Number of children: Not on file   Years of education: Not on file   Highest education level: Not on file  Occupational History   Not on file  Tobacco Use   Smoking status: Never   Smokeless tobacco: Never   Tobacco comments:    Never smoke 12/06/21  Substance and Sexual Activity   Alcohol use: Yes    Alcohol/week: 10.0 standard drinks of alcohol    Types: 10 Glasses  of wine per week    Comment: 1-2 glasses of wine nightly 12/06/21   Drug use: Yes    Frequency: 1.0 times per week    Comment: edibles   Sexual activity: Not on file  Other Topics Concern   Not on file  Social History Narrative   Not on file   Social Determinants of Health   Financial Resource Strain: Not on file  Food Insecurity: Not on file  Transportation Needs: Not on file  Physical Activity: Not on file  Stress: Not on file  Social Connections: Not on file  Intimate Partner Violence: Not on file     ROS- All systems are reviewed and negative except as per the HPI above.  Physical Exam: Vitals:   12/06/21 1009  BP: 110/70  Pulse: 64  Weight: 63.3 kg  Height: 5' 8.5" (1.74 m)    GEN- The patient is a well appearing elderly male, alert and oriented x 3 today.   Head- normocephalic, atraumatic Eyes-  Sclera clear, conjunctiva pink Ears- hearing intact Oropharynx- clear Neck- supple  Lungs- Clear to ausculation bilaterally, normal work of breathing Heart- irregular rate and rhythm, no murmurs, rubs or gallops  GI- soft, NT, ND, + BS Extremities- no clubbing, cyanosis, or edema MS- no significant deformity or atrophy Skin- no rash or lesion Psych- euthymic mood, full affect Neuro- strength and sensation are intact  Wt Readings from Last 3 Encounters:  12/06/21 63.3 kg  11/02/21 61 kg  09/29/21 60.3 kg    EKG today demonstrates  Typical atrial flutter with variable AV block Vent. rate 64 BPM PR interval * ms QRS duration 82 ms QT/QTcB 392/404 ms  Echo 01/19/18 demonstrated  Left ventricle: The cavity size was normal. Wall thickness was    normal. Systolic function was normal. The estimated ejection    fraction was in the range of 50% to 55%. Wall motion was normal;    there were no regional wall motion abnormalities. The study is    not technically sufficient to allow evaluation of LV diastolic    function.  - Left atrium: The atrium was mildly  dilated.  - Right atrium: The atrium was mildly dilated.   Impressions:   - Normal LV systolic function; mild biatrial enlargement; mild TR.   Epic records are reviewed at length today  CHA2DS2-VASc Score = 3  The patient's score is based upon: CHF History: 0 HTN History: 0 Diabetes History: 0 Stroke History: 0 Vascular Disease History: 1 Age Score: 2 Gender Score: 0       ASSESSMENT AND PLAN: 1. Atrial flutter The patient's CHA2DS2-VASc score is 3, indicating a 3.2% annual risk of stroke.   Patient in rate controlled atrial flutter.  We discussed rhythm control options today including DCCV and flutter ablation. Would avoid class IC with h/o NSTEMI. Bradycardia also limits AAD options. After discussing the risks and benefits, patient agreeable to DCCV. His Eliquis  was resumed 11/24/21. Continue Eliquis 5 mg BID Will not start AV nodal agents given his good rate control and baseline bradycardia in SR.  2. Secondary Hypercoagulable State (ICD10:  D68.69) The patient is at significant risk for stroke/thromboembolism based upon his CHA2DS2-VASc Score of 3.  Continue Apixaban (Eliquis).   3. H/o NSTEMI No angiographically significant CAD No anginal symptoms.   Follow up in the AF clinic post DCCV.    [facility] [address] [place], [place] [address] [phone] 12/06/2021 1:26 PM

## 2021-12-13 DIAGNOSIS — H903 Sensorineural hearing loss, bilateral: Secondary | ICD-10-CM | POA: Diagnosis not present

## 2021-12-28 ENCOUNTER — Ambulatory Visit: Payer: Medicare PPO | Admitting: Physician Assistant

## 2022-01-02 ENCOUNTER — Ambulatory Visit (HOSPITAL_BASED_OUTPATIENT_CLINIC_OR_DEPARTMENT_OTHER): Payer: Medicare PPO | Admitting: Anesthesiology

## 2022-01-02 ENCOUNTER — Ambulatory Visit (HOSPITAL_COMMUNITY): Admission: RE | Admit: 2022-01-02 | Payer: Medicare PPO | Source: Ambulatory Visit | Admitting: Physician Assistant

## 2022-01-02 ENCOUNTER — Encounter (HOSPITAL_COMMUNITY): Payer: Self-pay

## 2022-01-02 ENCOUNTER — Encounter (HOSPITAL_COMMUNITY): Payer: Self-pay | Admitting: Internal Medicine

## 2022-01-02 ENCOUNTER — Encounter (HOSPITAL_COMMUNITY)
Admission: RE | Disposition: A | Discharge status: Home / Self Care | Payer: Self-pay | Source: Home / Self Care | Attending: Internal Medicine

## 2022-01-02 ENCOUNTER — Other Ambulatory Visit: Payer: Self-pay

## 2022-01-02 ENCOUNTER — Ambulatory Visit (HOSPITAL_COMMUNITY): Payer: Medicare PPO | Admitting: Anesthesiology

## 2022-01-02 ENCOUNTER — Ambulatory Visit (HOSPITAL_COMMUNITY)
Admission: RE | Admit: 2022-01-02 | Discharge: 2022-01-02 | Disposition: A | Discharge status: Home / Self Care | Payer: Medicare PPO | Attending: Internal Medicine | Admitting: Internal Medicine

## 2022-01-02 DIAGNOSIS — N289 Disorder of kidney and ureter, unspecified: Secondary | ICD-10-CM

## 2022-01-02 DIAGNOSIS — I4892 Unspecified atrial flutter: Secondary | ICD-10-CM

## 2022-01-02 DIAGNOSIS — D6869 Other thrombophilia: Secondary | ICD-10-CM | POA: Diagnosis not present

## 2022-01-02 DIAGNOSIS — N182 Chronic kidney disease, stage 2 (mild): Secondary | ICD-10-CM | POA: Insufficient documentation

## 2022-01-02 DIAGNOSIS — I130 Hypertensive heart and chronic kidney disease with heart failure and stage 1 through stage 4 chronic kidney disease, or unspecified chronic kidney disease: Secondary | ICD-10-CM | POA: Insufficient documentation

## 2022-01-02 DIAGNOSIS — Z79899 Other long term (current) drug therapy: Secondary | ICD-10-CM | POA: Diagnosis not present

## 2022-01-02 DIAGNOSIS — I252 Old myocardial infarction: Secondary | ICD-10-CM | POA: Diagnosis not present

## 2022-01-02 DIAGNOSIS — I483 Typical atrial flutter: Secondary | ICD-10-CM

## 2022-01-02 DIAGNOSIS — E785 Hyperlipidemia, unspecified: Secondary | ICD-10-CM | POA: Diagnosis not present

## 2022-01-02 DIAGNOSIS — Z7901 Long term (current) use of anticoagulants: Secondary | ICD-10-CM | POA: Diagnosis not present

## 2022-01-02 DIAGNOSIS — I4891 Unspecified atrial fibrillation: Secondary | ICD-10-CM

## 2022-01-02 HISTORY — PX: CARDIOVERSION: SHX1299

## 2022-01-02 LAB — POCT I-STAT, CHEM 8
BUN: 17 mg/dL (ref 8–23)
Calcium, Ion: 1.13 mmol/L — ABNORMAL LOW (ref 1.15–1.40)
Chloride: 104 mmol/L (ref 98–111)
Creatinine, Ser: 1 mg/dL (ref 0.61–1.24)
Glucose, Bld: 123 mg/dL — ABNORMAL HIGH (ref 70–99)
HCT: 44 % (ref 39.0–52.0)
Hemoglobin: 15 g/dL (ref 13.0–17.0)
Potassium: 4.4 mmol/L (ref 3.5–5.1)
Sodium: 143 mmol/L (ref 135–145)
TCO2: 26 mmol/L (ref 22–32)

## 2022-01-02 SURGERY — CARDIOVERSION
Anesthesia: General

## 2022-01-02 MED ORDER — LIDOCAINE 2% (20 MG/ML) 5 ML SYRINGE
INTRAMUSCULAR | Status: DC | PRN
  Administered 2022-01-02: 60 mg via INTRAVENOUS

## 2022-01-02 MED ORDER — SODIUM CHLORIDE 0.9 % IV SOLN: INTRAVENOUS | Status: DC

## 2022-01-02 MED ORDER — PROPOFOL 10 MG/ML IV BOLUS
INTRAVENOUS | Status: DC | PRN
  Administered 2022-01-02: 30 mg via INTRAVENOUS
  Administered 2022-01-02: 50 mg via INTRAVENOUS

## 2022-01-02 MED ORDER — SODIUM CHLORIDE 0.9 % IV SOLN
INTRAVENOUS | Status: AC | PRN
  Administered 2022-01-02: 500 mL via INTRAVENOUS

## 2022-01-02 NOTE — CV Procedure (Signed)
Procedure: Electrical Cardioversion Indications:  Atrial Flutter  Procedure Details:  Consent: Risks of procedure as well as the alternatives and risks of each were explained to the (patient/caregiver).  Consent for procedure obtained.  Time Out: Verified patient identification, verified procedure, site/side was marked, verified correct patient position, special equipment/implants available, medications/allergies/relevent history reviewed, required imaging and test results available. PERFORMED.  Patient placed on cardiac monitor, pulse oximetry, supplemental oxygen as necessary.  Sedation given:  propofol per anesthesia Pacer pads placed anterior and posterior chest.  Cardioverted 1 time(s).  Cardioversion with synchronized biphasic 120J shock.  Evaluation: Findings: Post procedure EKG shows:  sinus bradycardia Complications: None Patient did tolerate procedure well.  Time Spent Directly with the Patient:  30 minutes   Parke Poisson 01/02/2022, 10:30 AM

## 2022-01-02 NOTE — Interval H&P Note (Signed)
History and Physical Interval Note:  01/02/2022 10:03 AM  Karn Pickler  has presented today for surgery, with the diagnosis of AFIB.  The various methods of treatment have been discussed with the patient and family. After consideration of risks, benefits and other options for treatment, the patient has consented to  Procedure(s): CARDIOVERSION (N/A) as a surgical intervention.  The patient's history has been reviewed, patient examined, no change in status, stable for surgery.  I have reviewed the patient's chart and labs.  Questions were answered to the patient's satisfaction.     Parke Poisson

## 2022-01-02 NOTE — Transfer of Care (Signed)
Immediate Anesthesia Transfer of Care Note  Patient: Richard Perkins  Procedure(s) Performed: CARDIOVERSION  Patient Location: Endoscopy Unit  Anesthesia Type:General  Level of Consciousness: drowsy and patient cooperative  Airway & Oxygen Therapy: Patient Spontanous Breathing and Patient connected to nasal cannula oxygen  Post-op Assessment: Report given to RN and Post -op Vital signs reviewed and stable  Post vital signs: Reviewed and stable  Last Vitals:  Vitals Value Taken Time  BP 92/60   Temp    Pulse 56   Resp 14   SpO2 100     Last Pain:  Vitals:   01/02/22 1005  TempSrc: Temporal  PainSc: 0-No pain         Complications: No notable events documented.

## 2022-01-02 NOTE — Anesthesia Postprocedure Evaluation (Signed)
Anesthesia Post Note  Patient: Richard Perkins  Procedure(s) Performed: CARDIOVERSION     Patient location during evaluation: Endoscopy Anesthesia Type: General Level of consciousness: awake and alert Pain management: pain level controlled Vital Signs Assessment: post-procedure vital signs reviewed and stable Respiratory status: spontaneous breathing, nonlabored ventilation and respiratory function stable Cardiovascular status: blood pressure returned to baseline and stable Postop Assessment: no apparent nausea or vomiting Anesthetic complications: no  No notable events documented.  Last Vitals:  Vitals:   01/02/22 1040 01/02/22 1050  BP: 100/64 99/66  Pulse: (!) 56 (!) 58  Resp: 14 15  Temp:    SpO2: 100% 100%    Last Pain:  Vitals:   01/02/22 1050  TempSrc:   PainSc: 0-No pain                 Renu Asby,W. EDMOND

## 2022-01-02 NOTE — Discharge Instructions (Signed)
Electrical Cardioversion  Electrical cardioversion is the delivery of a jolt of electricity to restore a normal rhythm to the heart. A rhythm that is too fast or is not regular keeps the heart from pumping well. In this procedure, sticky patches or metal paddles are placed on the chest to deliver electricity to the heart from a device.  If this information does not answer your questions, please call [facility] office at [phone] to clarify.   Follow these instructions at home: You may have some redness on the skin where the shocks were given.  You may apply over-the-counter hydrocortisone cream or aloe vera to alleviate skin irritation. YOU SHOULD NOT DRIVE, use power tools, machinery or perform tasks that involve climbing or major physical exertion for 24 hours (because of the sedation medicines used during the test).  Take over-the-counter and prescription medicines only as told by your health care provider. Ask your health care provider how to check your pulse. Check it often. Rest for 48 hours after the procedure or as told by your health care provider. Avoid or limit your caffeine use as told by your health care provider. Keep all follow-up visits as told by your health care provider. This is important.  FOLLOW UP:  Please also call with any specific questions about appointments or follow up tests. Electrical Cardioversion  Electrical cardioversion is the delivery of a jolt of electricity to restore a normal rhythm to the heart. A rhythm that is too fast or is not regular keeps the heart from pumping well. In this procedure, sticky patches or metal paddles are placed on the chest to deliver electricity to the heart from a device.  If this information does not answer your questions, please call [facility] office at [phone] to clarify.   Follow these instructions at home: You may have some redness on the skin where the shocks were  given.  You may apply over-the-counter hydrocortisone cream or aloe vera to alleviate skin irritation. YOU SHOULD NOT DRIVE, use power tools, machinery or perform tasks that involve climbing or major physical exertion for 24 hours (because of the sedation medicines used during the test).  Take over-the-counter and prescription medicines only as told by your health care provider. Ask your health care provider how to check your pulse. Check it often. Rest for 48 hours after the procedure or as told by your health care provider. Avoid or limit your caffeine use as told by your health care provider. Keep all follow-up visits as told by your health care provider. This is important.  FOLLOW UP:  Please also call with any specific questions about appointments or follow up tests.  

## 2022-01-02 NOTE — Anesthesia Procedure Notes (Signed)
Procedure Name: General with mask airway Date/Time: 01/02/2022 10:26 AM  Performed by: Demetrio Lapping, CRNAPre-anesthesia Checklist: Patient identified, Emergency Drugs available, Suction available, Patient being monitored and Timeout performed Patient Re-evaluated:Patient Re-evaluated prior to induction Oxygen Delivery Method: Ambu bag Preoxygenation: Pre-oxygenation with 100% oxygen Induction Type: IV induction Ventilation: Mask ventilation without difficulty Placement Confirmation: positive ETCO2 Dental Injury: Teeth and Oropharynx as per pre-operative assessment

## 2022-01-02 NOTE — Anesthesia Preprocedure Evaluation (Signed)
Anesthesia Evaluation  Patient identified by MRN, date of birth, ID band Patient awake    Reviewed: Allergy & Precautions, H&P , NPO status , Patient's Chart, lab work & pertinent test results  Airway Mallampati: II  TM Distance: >3 FB Neck ROM: Full    Dental no notable dental hx. (+) Teeth Intact, Dental Advisory Given   Pulmonary neg pulmonary ROS   Pulmonary exam normal breath sounds clear to auscultation       Cardiovascular + Past MI  + dysrhythmias Atrial Fibrillation  Rhythm:Irregular Rate:Tachycardia     Neuro/Psych negative neurological ROS  negative psych ROS   GI/Hepatic negative GI ROS, Neg liver ROS,,,  Endo/Other  negative endocrine ROS    Renal/GU Renal InsufficiencyRenal disease  negative genitourinary   Musculoskeletal   Abdominal   Peds  Hematology negative hematology ROS (+)   Anesthesia Other Findings   Reproductive/Obstetrics negative OB ROS                             Anesthesia Physical Anesthesia Plan  ASA: 3  Anesthesia Plan: General   Post-op Pain Management: Minimal or no pain anticipated   Induction: Intravenous  PONV Risk Score and Plan: 2 and Propofol infusion and Treatment may vary due to age or medical condition  Airway Management Planned: Mask  Additional Equipment:   Intra-op Plan:   Post-operative Plan:   Informed Consent: I have reviewed the patients History and Physical, chart, labs and discussed the procedure including the risks, benefits and alternatives for the proposed anesthesia with the patient or authorized representative who has indicated his/her understanding and acceptance.     Dental advisory given  Plan Discussed with: CRNA  Anesthesia Plan Comments:        Anesthesia Quick Evaluation

## 2022-01-04 DIAGNOSIS — N401 Enlarged prostate with lower urinary tract symptoms: Secondary | ICD-10-CM | POA: Diagnosis not present

## 2022-01-04 DIAGNOSIS — R351 Nocturia: Secondary | ICD-10-CM | POA: Diagnosis not present

## 2022-01-04 DIAGNOSIS — R972 Elevated prostate specific antigen [PSA]: Secondary | ICD-10-CM | POA: Diagnosis not present

## 2022-01-05 ENCOUNTER — Encounter (HOSPITAL_COMMUNITY): Payer: Self-pay | Admitting: Internal Medicine

## 2022-01-05 DIAGNOSIS — H903 Sensorineural hearing loss, bilateral: Secondary | ICD-10-CM | POA: Diagnosis not present

## 2022-01-19 ENCOUNTER — Ambulatory Visit (HOSPITAL_COMMUNITY)
Admission: RE | Admit: 2022-01-19 | Discharge: 2022-01-19 | Disposition: A | Discharge status: Home / Self Care | Payer: Medicare PPO | Source: Ambulatory Visit | Attending: Physician Assistant | Admitting: Physician Assistant

## 2022-01-19 VITALS — BP 108/64 | HR 71 | Ht 68.5 in | Wt 139.6 lb

## 2022-01-19 DIAGNOSIS — Z7901 Long term (current) use of anticoagulants: Secondary | ICD-10-CM | POA: Diagnosis not present

## 2022-01-19 DIAGNOSIS — E785 Hyperlipidemia, unspecified: Secondary | ICD-10-CM | POA: Insufficient documentation

## 2022-01-19 DIAGNOSIS — R001 Bradycardia, unspecified: Secondary | ICD-10-CM | POA: Insufficient documentation

## 2022-01-19 DIAGNOSIS — I483 Typical atrial flutter: Secondary | ICD-10-CM

## 2022-01-19 DIAGNOSIS — I959 Hypotension, unspecified: Secondary | ICD-10-CM | POA: Insufficient documentation

## 2022-01-19 DIAGNOSIS — D6869 Other thrombophilia: Secondary | ICD-10-CM | POA: Insufficient documentation

## 2022-01-19 DIAGNOSIS — I252 Old myocardial infarction: Secondary | ICD-10-CM | POA: Insufficient documentation

## 2022-01-19 DIAGNOSIS — N182 Chronic kidney disease, stage 2 (mild): Secondary | ICD-10-CM | POA: Diagnosis not present

## 2022-01-19 NOTE — Progress Notes (Signed)
Primary Care Physician: Daisy Floro, MD Primary Cardiologist: Dr Mayford Knife Primary Electrophysiologist: none Referring Physician: Dr Little Ishikawa is a 77 y.o. male with a history of CKD, hypotension, HLD, NSTEMI without CAD, atrial flutter who presents for consultation in the [facility].  The patient was initially diagnosed with atrial flutter in 2020 in the setting of NSTEMI. He was rate controlled on no AV nodal agents. He spontaneously converted prior to outpatient follow up. Patient is on Eliquis for a CHADS2VASC score of 3. Patient was scheduled for hernia repair in [place] and was found to be back in atrial flutter, surgery was cancelled.   On follow up today, patient is s/p DCCV on 01/02/22. Unfortunately, he is back in atrial flutter again today. He states that he really did not feel different in SR. Possibly had a little more stamina.   Today, he denies symptoms of palpitations, chest pain, shortness of breath, orthopnea, PND, lower extremity edema, dizziness, presyncope, syncope, snoring, daytime somnolence, bleeding, or neurologic sequela. The patient is tolerating medications without difficulties and is otherwise without complaint today.    Atrial Fibrillation Risk Factors:  he does not have symptoms or diagnosis of sleep apnea. he does not have a history of rheumatic fever.   he has a BMI of Body mass index is 20.92 kg/m.Marland Kitchen Filed Weights   01/19/22 1002  Weight: 63.3 kg    Family History  Problem Relation Age of Onset   Heart failure Mother      Atrial Fibrillation Management history:  Previous antiarrhythmic drugs: none Previous cardioversions: 01/02/22 Previous ablations: none CHADS2VASC score: 3 Anticoagulation history: Eliquis   Past Medical History:  Diagnosis Date   Atrial flutter (HCC)    a. dx 01/2018, rate controlled.   CKD (chronic kidney disease), stage II    Hyperlipidemia    Hypotension     Appears baseline   NSTEMI (non-ST elevated myocardial infarction) (HCC)    a. 01/2018 - cath without significant CAD, unclear etiology - ? transient plaque rupture with recanalization vs spasm.   Urinary retention    Past Surgical History:  Procedure Laterality Date   BASAL CELL CARCINOMA EXCISION     CARDIOVERSION N/A 01/02/2022   Procedure: CARDIOVERSION;  Surgeon: Parke Poisson, MD;  Location: [facility] ENDOSCOPY;  Service: Cardiovascular;  Laterality: N/A;   COLONOSCOPY WITH PROPOFOL N/A 03/29/2015   Procedure: COLONOSCOPY WITH PROPOFOL;  Surgeon: Charolett Bumpers, MD;  Location: [facility] ENDOSCOPY;  Service: Endoscopy;  Laterality: N/A;   KNEE ARTHROSCOPY W/ MENISCAL REPAIR     LEFT HEART CATH AND CORONARY ANGIOGRAPHY N/A 01/21/2018   Procedure: LEFT HEART CATH AND CORONARY ANGIOGRAPHY;  Surgeon: Yvonne Kendall, MD;  Location: [facility] INVASIVE CV LAB;  Service: Cardiovascular;  Laterality: N/A;   VARICOSE VEIN SURGERY      Current Outpatient Medications  Medication Sig Dispense Refill   acetaminophen (TYLENOL) 650 MG CR tablet Take 650 mg by mouth every 8 (eight) hours as needed for pain.     alum & mag hydroxide-simeth (MAALOX/MYLANTA) 200-200-20 MG/5ML suspension Take 15 mLs by mouth every 6 (six) hours as needed for indigestion or heartburn.     apixaban (ELIQUIS) 5 MG TABS tablet Take 1 tablet (5 mg total) by mouth 2 (two) times daily. 180 tablet 5   atorvastatin (LIPITOR) 40 MG tablet Take 1 tablet (40 mg total) by mouth every evening. 30 tablet 6   Cholecalciferol (VITAMIN D) 50 MCG (2000  UT) tablet Take 2,000 Units by mouth 3 (three) times a week.     Magnesium 400 MG TABS Take 400 mg by mouth daily as needed (cramps).     tamsulosin (FLOMAX) 0.4 MG CAPS capsule Take 0.4 mg by mouth every other day.     No current facility-administered medications for this encounter.    No Known Allergies  Social History   Socioeconomic History   Marital status: Single    Spouse name: Not on file    Number of children: Not on file   Years of education: Not on file   Highest education level: Not on file  Occupational History   Not on file  Tobacco Use   Smoking status: Never   Smokeless tobacco: Never   Tobacco comments:    Never smoke 12/06/21  Substance and Sexual Activity   Alcohol use: Yes    Alcohol/week: 10.0 standard drinks of alcohol    Types: 10 Glasses of wine per week    Comment: 1-2 glasses of wine nightly 12/06/21   Drug use: Yes    Frequency: 1.0 times per week    Comment: edibles   Sexual activity: Not on file  Other Topics Concern   Not on file  Social History Narrative   Not on file   Social Determinants of Health   Financial Resource Strain: Not on file  Food Insecurity: Not on file  Transportation Needs: Not on file  Physical Activity: Not on file  Stress: Not on file  Social Connections: Not on file  Intimate Partner Violence: Not on file     ROS- All systems are reviewed and negative except as per the HPI above.  Physical Exam: Vitals:   01/19/22 1002  BP: 108/64  Pulse: 71  Weight: 63.3 kg  Height: 5' 8.5" (1.74 m)     GEN- The patient is a well appearing elderly male, alert and oriented x 3 today.   HEENT-head normocephalic, atraumatic, sclera clear, conjunctiva pink, hearing intact, trachea midline. Lungs- Clear to ausculation bilaterally, normal work of breathing Heart- irregular rate and rhythm, no murmurs, rubs or gallops  GI- soft, NT, ND, + BS Extremities- no clubbing, cyanosis, or edema MS- no significant deformity or atrophy Skin- no rash or lesion Psych- euthymic mood, full affect Neuro- strength and sensation are intact   Wt Readings from Last 3 Encounters:  01/19/22 63.3 kg  01/02/22 61.2 kg  12/06/21 63.3 kg    EKG today demonstrates  Atrial flutter with variable block Vent. rate 71 BPM PR interval * ms QRS duration 74 ms QT/QTcB 392/425 ms   Echo 01/19/18 demonstrated  Left ventricle: The cavity size was  normal. Wall thickness was    normal. Systolic function was normal. The estimated ejection    fraction was in the range of 50% to 55%. Wall motion was normal;    there were no regional wall motion abnormalities. The study is    not technically sufficient to allow evaluation of LV diastolic    function.  - Left atrium: The atrium was mildly dilated.  - Right atrium: The atrium was mildly dilated.   Impressions:   - Normal LV systolic function; mild biatrial enlargement; mild TR.   Epic records are reviewed at length today  CHA2DS2-VASc Score = 3  The patient's score is based upon: CHF History: 0 HTN History: 0 Diabetes History: 0 Stroke History: 0 Vascular Disease History: 1 Age Score: 2 Gender Score: 0  ASSESSMENT AND PLAN: 1. Atrial flutter The patient's CHA2DS2-VASc score is 3, indicating a 3.2% annual risk of stroke.   S/p DCCV 01/02/22 with quick return of flutter. We discussed rhythm control options today. Would avoid class IC with h/o NSTEMI. Bradycardia also limits AAD options. We discussed atrial flutter ablation at length today. He would like to have this done in [place] as he will be spending more time there over the next several months. He already had a consultation scheduled with cardiology there.  Continue Eliquis 5 mg BID  2. Secondary Hypercoagulable State (ICD10:  D68.69) The patient is at significant risk for stroke/thromboembolism based upon his CHA2DS2-VASc Score of 3.  Continue Apixaban (Eliquis).   3. H/o NSTEMI No angiographically significant CAD No anginal symptoms.   Follow up in AF clinic as needed.    [facility] [address] [place], [place] [address] [phone] 01/19/2022 10:23 AM

## 2022-01-23 DIAGNOSIS — H524 Presbyopia: Secondary | ICD-10-CM | POA: Diagnosis not present

## 2022-02-15 ENCOUNTER — Ambulatory Visit: Payer: Medicare PPO | Admitting: Cardiology

## 2022-02-20 DIAGNOSIS — U071 COVID-19: Secondary | ICD-10-CM | POA: Diagnosis not present

## 2022-02-20 DIAGNOSIS — Z6822 Body mass index (BMI) 22.0-22.9, adult: Secondary | ICD-10-CM | POA: Diagnosis not present

## 2022-03-28 DIAGNOSIS — I4430 Unspecified atrioventricular block: Secondary | ICD-10-CM | POA: Diagnosis not present

## 2022-03-28 DIAGNOSIS — I4892 Unspecified atrial flutter: Secondary | ICD-10-CM | POA: Diagnosis not present

## 2022-03-28 DIAGNOSIS — R9431 Abnormal electrocardiogram [ECG] [EKG]: Secondary | ICD-10-CM | POA: Diagnosis not present

## 2022-04-05 ENCOUNTER — Encounter (HOSPITAL_BASED_OUTPATIENT_CLINIC_OR_DEPARTMENT_OTHER): Payer: Self-pay | Admitting: Cardiology

## 2022-04-07 ENCOUNTER — Other Ambulatory Visit (HOSPITAL_COMMUNITY): Payer: Self-pay | Admitting: *Deleted

## 2022-04-07 DIAGNOSIS — I483 Typical atrial flutter: Secondary | ICD-10-CM

## 2022-04-25 DIAGNOSIS — Z7901 Long term (current) use of anticoagulants: Secondary | ICD-10-CM | POA: Diagnosis not present

## 2022-04-25 DIAGNOSIS — I4892 Unspecified atrial flutter: Secondary | ICD-10-CM | POA: Diagnosis not present

## 2022-04-27 DIAGNOSIS — I4892 Unspecified atrial flutter: Secondary | ICD-10-CM | POA: Diagnosis not present

## 2022-05-05 DIAGNOSIS — I483 Typical atrial flutter: Secondary | ICD-10-CM | POA: Diagnosis not present

## 2022-05-05 DIAGNOSIS — N189 Chronic kidney disease, unspecified: Secondary | ICD-10-CM | POA: Diagnosis not present

## 2022-05-05 DIAGNOSIS — I4892 Unspecified atrial flutter: Secondary | ICD-10-CM | POA: Diagnosis not present

## 2022-05-05 DIAGNOSIS — Z7901 Long term (current) use of anticoagulants: Secondary | ICD-10-CM | POA: Diagnosis not present

## 2022-05-07 DIAGNOSIS — I998 Other disorder of circulatory system: Secondary | ICD-10-CM | POA: Diagnosis not present

## 2022-05-07 DIAGNOSIS — R9431 Abnormal electrocardiogram [ECG] [EKG]: Secondary | ICD-10-CM | POA: Diagnosis not present

## 2022-05-29 DIAGNOSIS — D6869 Other thrombophilia: Secondary | ICD-10-CM | POA: Diagnosis not present

## 2022-05-29 DIAGNOSIS — E7800 Pure hypercholesterolemia, unspecified: Secondary | ICD-10-CM | POA: Diagnosis not present

## 2022-05-29 DIAGNOSIS — E559 Vitamin D deficiency, unspecified: Secondary | ICD-10-CM | POA: Diagnosis not present

## 2022-05-29 DIAGNOSIS — Z79899 Other long term (current) drug therapy: Secondary | ICD-10-CM | POA: Diagnosis not present

## 2022-05-31 DIAGNOSIS — I4892 Unspecified atrial flutter: Secondary | ICD-10-CM | POA: Diagnosis not present

## 2022-05-31 DIAGNOSIS — Z79899 Other long term (current) drug therapy: Secondary | ICD-10-CM | POA: Diagnosis not present

## 2022-05-31 DIAGNOSIS — R7309 Other abnormal glucose: Secondary | ICD-10-CM | POA: Diagnosis not present

## 2022-05-31 DIAGNOSIS — E7800 Pure hypercholesterolemia, unspecified: Secondary | ICD-10-CM | POA: Diagnosis not present

## 2022-05-31 DIAGNOSIS — E559 Vitamin D deficiency, unspecified: Secondary | ICD-10-CM | POA: Diagnosis not present

## 2022-05-31 DIAGNOSIS — Z0000 Encounter for general adult medical examination without abnormal findings: Secondary | ICD-10-CM | POA: Diagnosis not present

## 2022-05-31 DIAGNOSIS — Z681 Body mass index (BMI) 19 or less, adult: Secondary | ICD-10-CM | POA: Diagnosis not present

## 2022-05-31 DIAGNOSIS — D6869 Other thrombophilia: Secondary | ICD-10-CM | POA: Diagnosis not present

## 2022-06-15 DIAGNOSIS — Z7901 Long term (current) use of anticoagulants: Secondary | ICD-10-CM | POA: Diagnosis not present

## 2022-06-15 DIAGNOSIS — R001 Bradycardia, unspecified: Secondary | ICD-10-CM | POA: Diagnosis not present

## 2022-06-15 DIAGNOSIS — I4892 Unspecified atrial flutter: Secondary | ICD-10-CM | POA: Diagnosis not present

## 2022-06-15 DIAGNOSIS — Z9889 Other specified postprocedural states: Secondary | ICD-10-CM | POA: Diagnosis not present

## 2022-07-07 DIAGNOSIS — I483 Typical atrial flutter: Secondary | ICD-10-CM | POA: Diagnosis not present

## 2022-07-27 DIAGNOSIS — L03119 Cellulitis of unspecified part of limb: Secondary | ICD-10-CM | POA: Diagnosis not present

## 2022-07-27 DIAGNOSIS — T63791A Toxic effect of contact with other venomous plant, accidental (unintentional), initial encounter: Secondary | ICD-10-CM | POA: Diagnosis not present

## 2022-07-27 DIAGNOSIS — S60551A Superficial foreign body of right hand, initial encounter: Secondary | ICD-10-CM | POA: Diagnosis not present

## 2022-08-16 DIAGNOSIS — N189 Chronic kidney disease, unspecified: Secondary | ICD-10-CM | POA: Diagnosis not present

## 2022-08-16 DIAGNOSIS — K4090 Unilateral inguinal hernia, without obstruction or gangrene, not specified as recurrent: Secondary | ICD-10-CM | POA: Diagnosis not present

## 2022-08-16 DIAGNOSIS — F17200 Nicotine dependence, unspecified, uncomplicated: Secondary | ICD-10-CM | POA: Diagnosis not present

## 2022-08-16 DIAGNOSIS — I4892 Unspecified atrial flutter: Secondary | ICD-10-CM | POA: Diagnosis not present

## 2022-08-19 DIAGNOSIS — R935 Abnormal findings on diagnostic imaging of other abdominal regions, including retroperitoneum: Secondary | ICD-10-CM | POA: Diagnosis not present

## 2022-08-19 DIAGNOSIS — R198 Other specified symptoms and signs involving the digestive system and abdomen: Secondary | ICD-10-CM | POA: Diagnosis not present

## 2022-08-19 DIAGNOSIS — K5900 Constipation, unspecified: Secondary | ICD-10-CM | POA: Diagnosis not present

## 2022-08-19 DIAGNOSIS — R109 Unspecified abdominal pain: Secondary | ICD-10-CM | POA: Diagnosis not present

## 2022-08-19 DIAGNOSIS — G8918 Other acute postprocedural pain: Secondary | ICD-10-CM | POA: Diagnosis not present

## 2022-09-21 DIAGNOSIS — L821 Other seborrheic keratosis: Secondary | ICD-10-CM | POA: Diagnosis not present

## 2022-09-21 DIAGNOSIS — L570 Actinic keratosis: Secondary | ICD-10-CM | POA: Diagnosis not present

## 2022-09-21 DIAGNOSIS — D485 Neoplasm of uncertain behavior of skin: Secondary | ICD-10-CM | POA: Diagnosis not present

## 2022-09-21 DIAGNOSIS — L858 Other specified epidermal thickening: Secondary | ICD-10-CM | POA: Diagnosis not present

## 2022-09-21 DIAGNOSIS — B079 Viral wart, unspecified: Secondary | ICD-10-CM | POA: Diagnosis not present

## 2022-09-21 DIAGNOSIS — D692 Other nonthrombocytopenic purpura: Secondary | ICD-10-CM | POA: Diagnosis not present

## 2022-09-21 DIAGNOSIS — B078 Other viral warts: Secondary | ICD-10-CM | POA: Diagnosis not present

## 2022-09-21 DIAGNOSIS — L812 Freckles: Secondary | ICD-10-CM | POA: Diagnosis not present

## 2022-09-21 DIAGNOSIS — Z85828 Personal history of other malignant neoplasm of skin: Secondary | ICD-10-CM | POA: Diagnosis not present

## 2023-02-13 DIAGNOSIS — H6093 Unspecified otitis externa, bilateral: Secondary | ICD-10-CM | POA: Diagnosis not present

## 2023-02-13 DIAGNOSIS — H6121 Impacted cerumen, right ear: Secondary | ICD-10-CM | POA: Diagnosis not present

## 2023-02-13 DIAGNOSIS — H9203 Otalgia, bilateral: Secondary | ICD-10-CM | POA: Diagnosis not present

## 2023-02-15 DIAGNOSIS — H66002 Acute suppurative otitis media without spontaneous rupture of ear drum, left ear: Secondary | ICD-10-CM | POA: Diagnosis not present

## 2023-02-15 DIAGNOSIS — H9202 Otalgia, left ear: Secondary | ICD-10-CM | POA: Diagnosis not present

## 2023-02-15 DIAGNOSIS — H903 Sensorineural hearing loss, bilateral: Secondary | ICD-10-CM | POA: Diagnosis not present

## 2023-02-19 IMAGING — CR DG SHOULDER 2+V*L*
3 series · 3 of 3 positions shown · non-contrast
Comparison: None.

CLINICAL DATA: Left shoulder pain.

EXAM:
LEFT SHOULDER - 2+ VIEW

[w shoulder ap internal left]
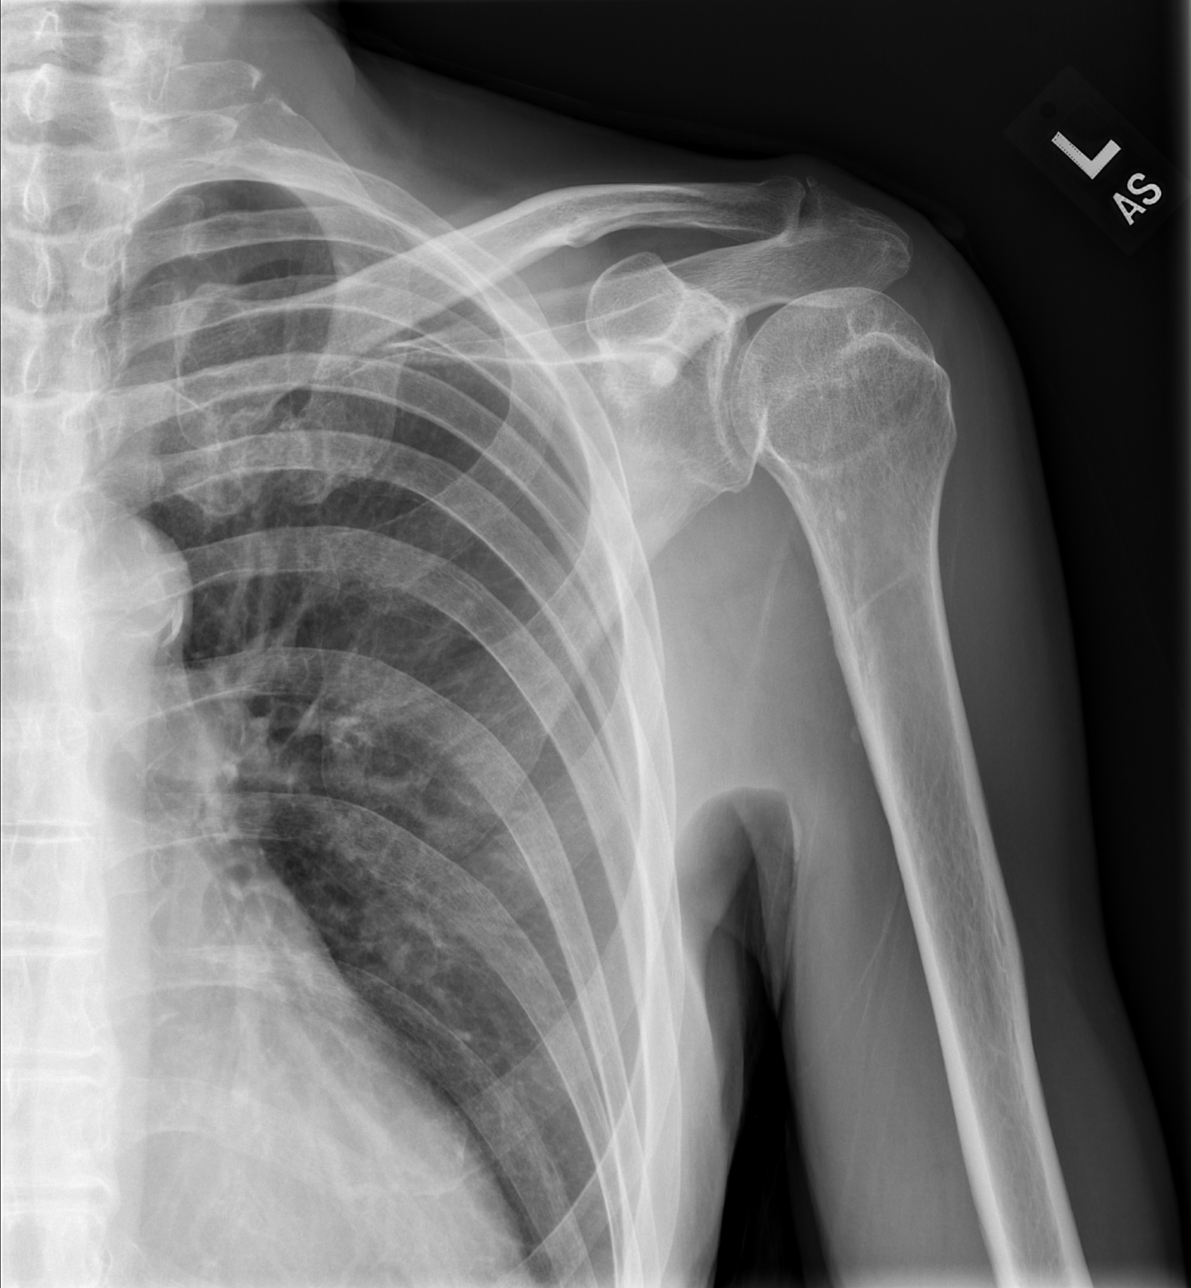

[w shoulder y view left]
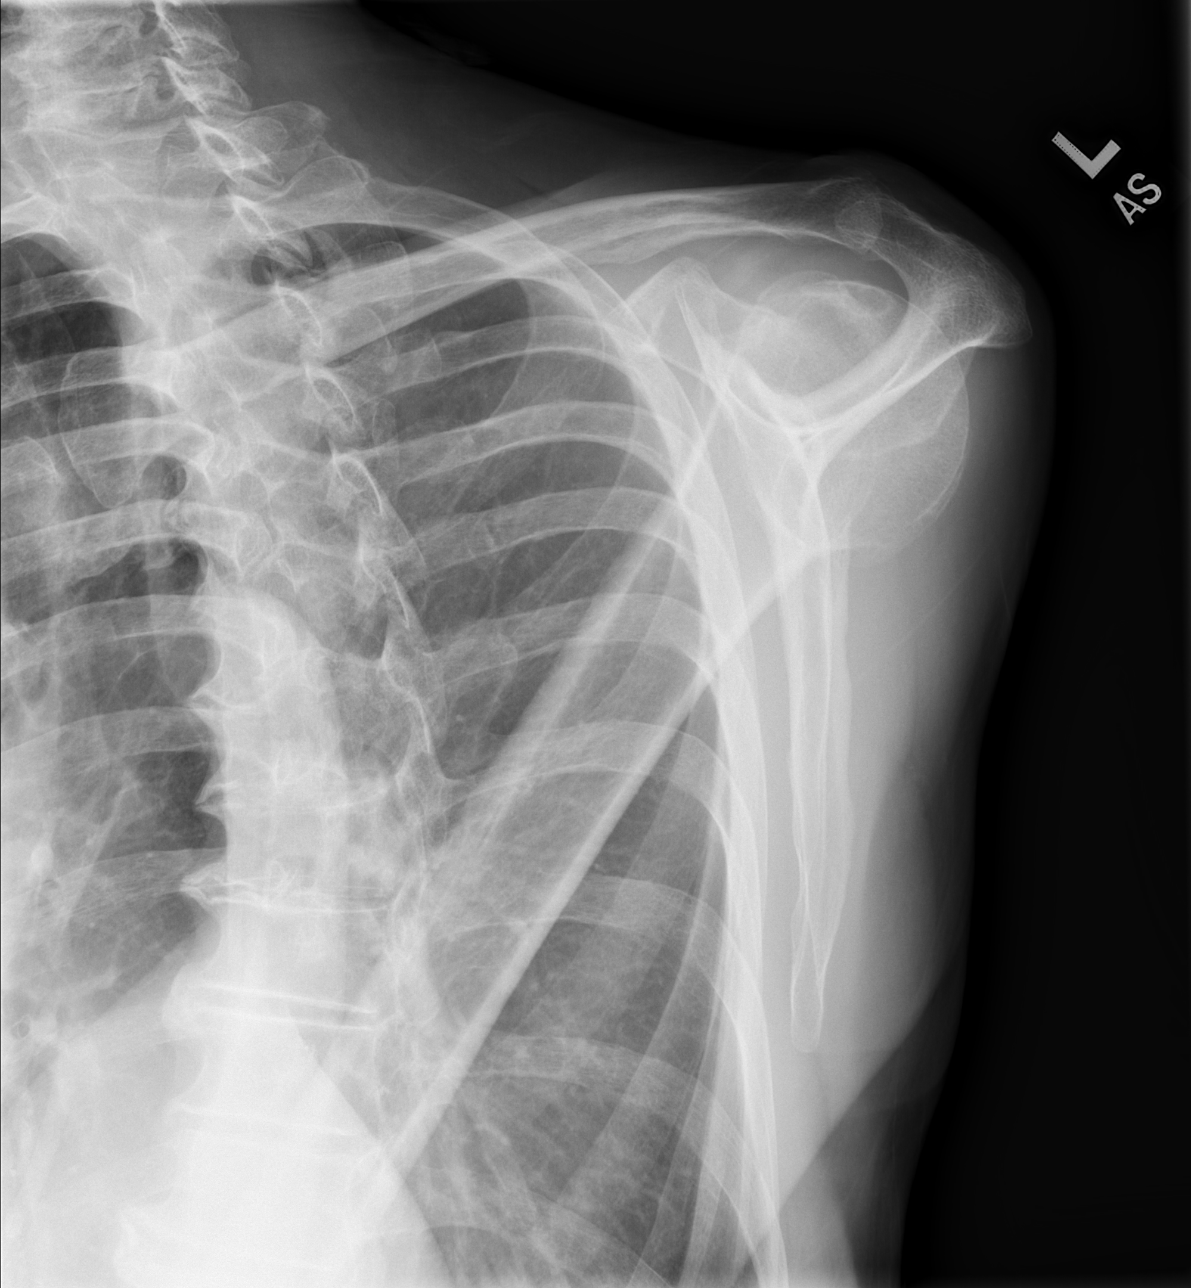

[w shoulder axillary left *]
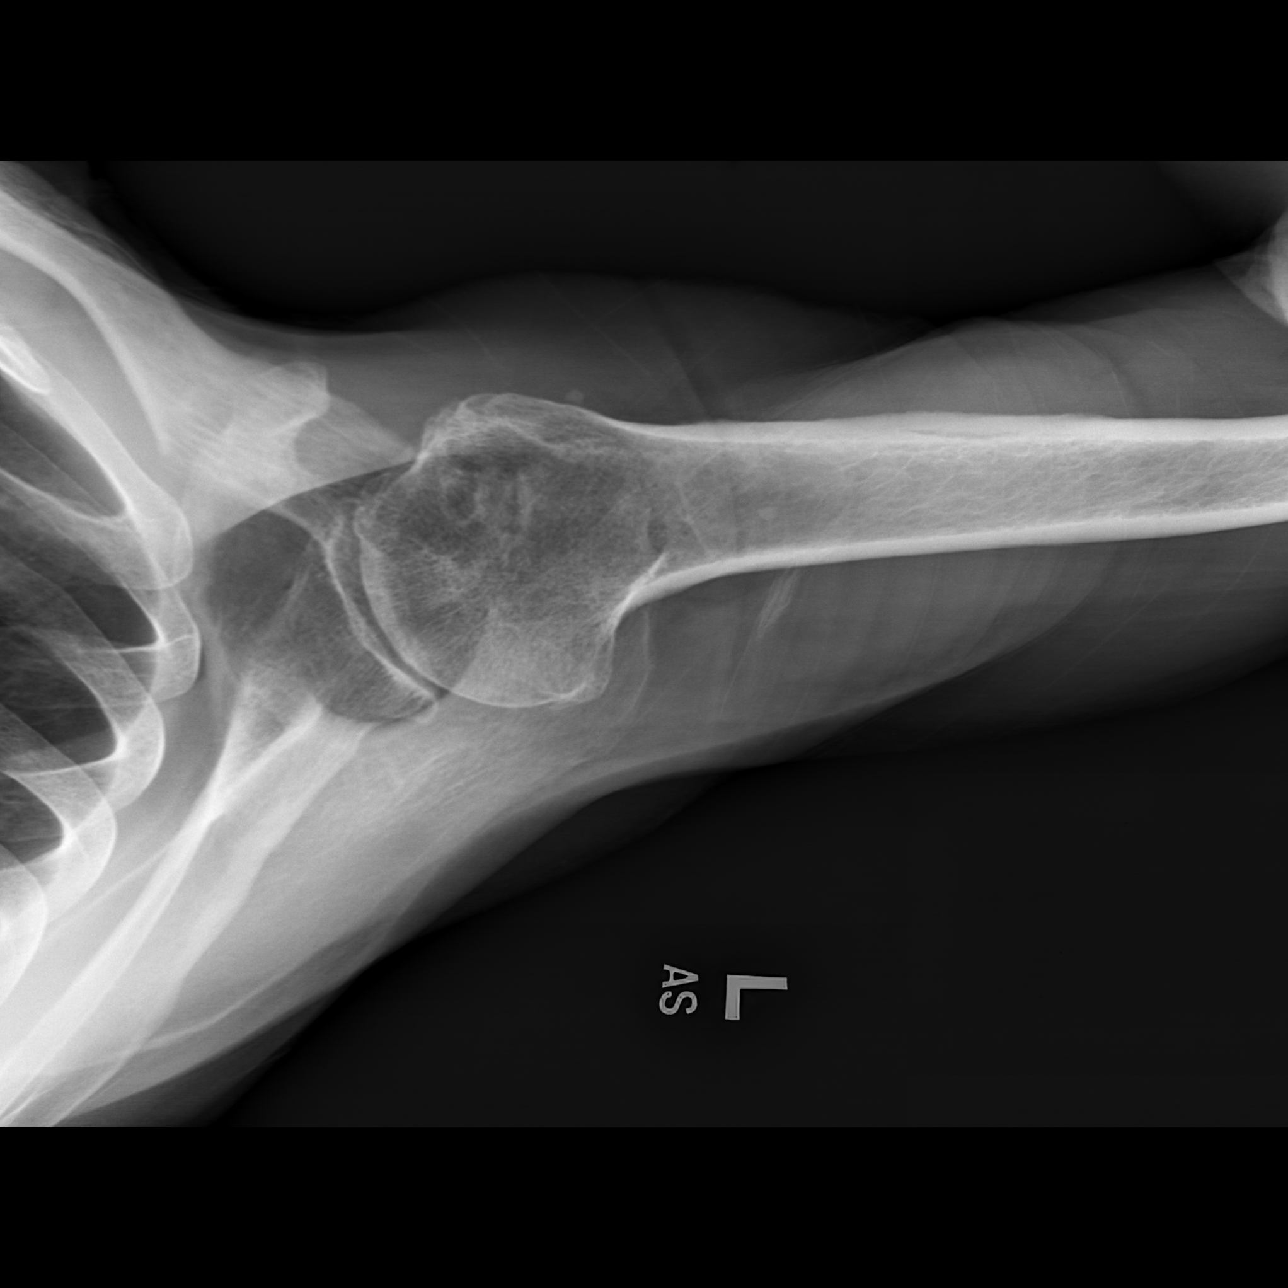

[3 of 3 positions shown; findings below may reference images not displayed]

FINDINGS: There is no evidence of fracture or dislocation. Mild degenerative
changes seen involving the left acromioclavicular and glenohumeral
joints. Soft tissues are unremarkable.
IMPRESSION: Mild degenerative joint disease involving the left acromioclavicular
and glenohumeral joints. No acute abnormality is noted.

## 2023-02-21 DIAGNOSIS — H6090 Unspecified otitis externa, unspecified ear: Secondary | ICD-10-CM | POA: Diagnosis not present

## 2023-02-21 DIAGNOSIS — Z974 Presence of external hearing-aid: Secondary | ICD-10-CM | POA: Diagnosis not present

## 2023-02-21 DIAGNOSIS — H9209 Otalgia, unspecified ear: Secondary | ICD-10-CM | POA: Diagnosis not present

## 2023-02-21 DIAGNOSIS — H9212 Otorrhea, left ear: Secondary | ICD-10-CM | POA: Diagnosis not present

## 2023-02-21 DIAGNOSIS — H6123 Impacted cerumen, bilateral: Secondary | ICD-10-CM | POA: Diagnosis not present

## 2023-02-21 DIAGNOSIS — H903 Sensorineural hearing loss, bilateral: Secondary | ICD-10-CM | POA: Diagnosis not present

## 2023-02-27 DIAGNOSIS — H6123 Impacted cerumen, bilateral: Secondary | ICD-10-CM | POA: Diagnosis not present

## 2023-02-27 DIAGNOSIS — Z85828 Personal history of other malignant neoplasm of skin: Secondary | ICD-10-CM | POA: Diagnosis not present

## 2023-02-27 DIAGNOSIS — I252 Old myocardial infarction: Secondary | ICD-10-CM | POA: Diagnosis not present

## 2023-02-27 DIAGNOSIS — F1290 Cannabis use, unspecified, uncomplicated: Secondary | ICD-10-CM | POA: Diagnosis not present

## 2023-02-27 DIAGNOSIS — H6090 Unspecified otitis externa, unspecified ear: Secondary | ICD-10-CM | POA: Diagnosis not present

## 2023-02-27 DIAGNOSIS — H9190 Unspecified hearing loss, unspecified ear: Secondary | ICD-10-CM | POA: Diagnosis not present

## 2023-03-05 DIAGNOSIS — H70893 Other mastoiditis and related conditions, bilateral: Secondary | ICD-10-CM | POA: Diagnosis not present

## 2023-03-05 DIAGNOSIS — Z01818 Encounter for other preprocedural examination: Secondary | ICD-10-CM | POA: Diagnosis not present

## 2023-04-04 DIAGNOSIS — I252 Old myocardial infarction: Secondary | ICD-10-CM | POA: Diagnosis not present

## 2023-04-04 DIAGNOSIS — H6090 Unspecified otitis externa, unspecified ear: Secondary | ICD-10-CM | POA: Diagnosis not present

## 2023-04-04 DIAGNOSIS — H903 Sensorineural hearing loss, bilateral: Secondary | ICD-10-CM | POA: Diagnosis not present

## 2023-04-04 DIAGNOSIS — Z85828 Personal history of other malignant neoplasm of skin: Secondary | ICD-10-CM | POA: Diagnosis not present

## 2023-04-04 DIAGNOSIS — H6123 Impacted cerumen, bilateral: Secondary | ICD-10-CM | POA: Diagnosis not present

## 2023-04-04 DIAGNOSIS — H9210 Otorrhea, unspecified ear: Secondary | ICD-10-CM | POA: Diagnosis not present

## 2023-04-04 DIAGNOSIS — H9319 Tinnitus, unspecified ear: Secondary | ICD-10-CM | POA: Diagnosis not present

## 2023-04-04 DIAGNOSIS — F1290 Cannabis use, unspecified, uncomplicated: Secondary | ICD-10-CM | POA: Diagnosis not present

## 2023-04-04 DIAGNOSIS — H9209 Otalgia, unspecified ear: Secondary | ICD-10-CM | POA: Diagnosis not present

## 2023-04-19 DIAGNOSIS — H524 Presbyopia: Secondary | ICD-10-CM | POA: Diagnosis not present

## 2023-04-19 DIAGNOSIS — H26493 Other secondary cataract, bilateral: Secondary | ICD-10-CM | POA: Diagnosis not present

## 2023-04-19 DIAGNOSIS — H52223 Regular astigmatism, bilateral: Secondary | ICD-10-CM | POA: Diagnosis not present

## 2023-04-19 DIAGNOSIS — H5203 Hypermetropia, bilateral: Secondary | ICD-10-CM | POA: Diagnosis not present

## 2023-05-15 DIAGNOSIS — L570 Actinic keratosis: Secondary | ICD-10-CM | POA: Diagnosis not present

## 2023-05-15 DIAGNOSIS — Z85828 Personal history of other malignant neoplasm of skin: Secondary | ICD-10-CM | POA: Diagnosis not present

## 2023-05-15 DIAGNOSIS — L578 Other skin changes due to chronic exposure to nonionizing radiation: Secondary | ICD-10-CM | POA: Diagnosis not present

## 2023-05-15 DIAGNOSIS — C44629 Squamous cell carcinoma of skin of left upper limb, including shoulder: Secondary | ICD-10-CM | POA: Diagnosis not present

## 2023-05-15 DIAGNOSIS — L853 Xerosis cutis: Secondary | ICD-10-CM | POA: Diagnosis not present

## 2023-05-15 DIAGNOSIS — Z08 Encounter for follow-up examination after completed treatment for malignant neoplasm: Secondary | ICD-10-CM | POA: Diagnosis not present

## 2023-05-15 DIAGNOSIS — B351 Tinea unguium: Secondary | ICD-10-CM | POA: Diagnosis not present

## 2023-05-15 DIAGNOSIS — D235 Other benign neoplasm of skin of trunk: Secondary | ICD-10-CM | POA: Diagnosis not present

## 2023-06-15 DIAGNOSIS — Z13220 Encounter for screening for lipoid disorders: Secondary | ICD-10-CM | POA: Diagnosis not present

## 2023-06-15 DIAGNOSIS — H903 Sensorineural hearing loss, bilateral: Secondary | ICD-10-CM | POA: Diagnosis not present

## 2023-06-15 DIAGNOSIS — I483 Typical atrial flutter: Secondary | ICD-10-CM | POA: Diagnosis not present

## 2023-06-15 DIAGNOSIS — G5601 Carpal tunnel syndrome, right upper limb: Secondary | ICD-10-CM | POA: Diagnosis not present

## 2023-06-15 DIAGNOSIS — G5602 Carpal tunnel syndrome, left upper limb: Secondary | ICD-10-CM | POA: Diagnosis not present

## 2023-06-15 DIAGNOSIS — R609 Edema, unspecified: Secondary | ICD-10-CM | POA: Diagnosis not present

## 2023-06-18 DIAGNOSIS — L570 Actinic keratosis: Secondary | ICD-10-CM | POA: Diagnosis not present

## 2023-06-18 DIAGNOSIS — C44629 Squamous cell carcinoma of skin of left upper limb, including shoulder: Secondary | ICD-10-CM | POA: Diagnosis not present

## 2023-06-19 DIAGNOSIS — R609 Edema, unspecified: Secondary | ICD-10-CM | POA: Diagnosis not present

## 2023-06-19 DIAGNOSIS — Z79899 Other long term (current) drug therapy: Secondary | ICD-10-CM | POA: Diagnosis not present

## 2023-06-19 DIAGNOSIS — Z13220 Encounter for screening for lipoid disorders: Secondary | ICD-10-CM | POA: Diagnosis not present

## 2023-07-02 DIAGNOSIS — H9313 Tinnitus, bilateral: Secondary | ICD-10-CM | POA: Diagnosis not present

## 2023-07-02 DIAGNOSIS — H903 Sensorineural hearing loss, bilateral: Secondary | ICD-10-CM | POA: Diagnosis not present

## 2023-07-23 DIAGNOSIS — R930 Abnormal findings on diagnostic imaging of skull and head, not elsewhere classified: Secondary | ICD-10-CM | POA: Diagnosis not present

## 2023-07-23 DIAGNOSIS — G3189 Other specified degenerative diseases of nervous system: Secondary | ICD-10-CM | POA: Diagnosis not present

## 2023-08-28 DIAGNOSIS — H903 Sensorineural hearing loss, bilateral: Secondary | ICD-10-CM | POA: Diagnosis not present

## 2023-10-24 DIAGNOSIS — H9313 Tinnitus, bilateral: Secondary | ICD-10-CM | POA: Diagnosis not present

## 2023-10-24 DIAGNOSIS — H903 Sensorineural hearing loss, bilateral: Secondary | ICD-10-CM | POA: Diagnosis not present

## 2023-10-30 DIAGNOSIS — Z23 Encounter for immunization: Secondary | ICD-10-CM | POA: Diagnosis not present

## 2023-10-30 DIAGNOSIS — Z0000 Encounter for general adult medical examination without abnormal findings: Secondary | ICD-10-CM | POA: Diagnosis not present

## 2023-12-25 DIAGNOSIS — L578 Other skin changes due to chronic exposure to nonionizing radiation: Secondary | ICD-10-CM | POA: Diagnosis not present

## 2023-12-25 DIAGNOSIS — Z08 Encounter for follow-up examination after completed treatment for malignant neoplasm: Secondary | ICD-10-CM | POA: Diagnosis not present

## 2023-12-25 DIAGNOSIS — L853 Xerosis cutis: Secondary | ICD-10-CM | POA: Diagnosis not present

## 2023-12-25 DIAGNOSIS — L570 Actinic keratosis: Secondary | ICD-10-CM | POA: Diagnosis not present

## 2023-12-25 DIAGNOSIS — D235 Other benign neoplasm of skin of trunk: Secondary | ICD-10-CM | POA: Diagnosis not present

## 2023-12-25 DIAGNOSIS — Z85828 Personal history of other malignant neoplasm of skin: Secondary | ICD-10-CM | POA: Diagnosis not present
